# Patient Record
Sex: Female | Born: 1975
Health system: Southern US, Community
[De-identification: ages and names within clinical notes are randomized; demographics above are authoritative.]

## PROBLEM LIST (undated history)

## (undated) DIAGNOSIS — J45909 Unspecified asthma, uncomplicated: Secondary | ICD-10-CM

## (undated) DIAGNOSIS — R05 Cough: Secondary | ICD-10-CM

## (undated) DIAGNOSIS — K219 Gastro-esophageal reflux disease without esophagitis: Secondary | ICD-10-CM

## (undated) DIAGNOSIS — M26609 Unspecified temporomandibular joint disorder, unspecified side: Secondary | ICD-10-CM

## (undated) DIAGNOSIS — J310 Chronic rhinitis: Secondary | ICD-10-CM

## (undated) DIAGNOSIS — R053 Chronic cough: Secondary | ICD-10-CM

## (undated) HISTORY — DX: Unspecified temporomandibular joint disorder, unspecified side: M26.609

## (undated) HISTORY — DX: Chronic cough: R05.3

## (undated) HISTORY — DX: Unspecified asthma, uncomplicated: J45.909

## (undated) HISTORY — PX: BREAST ENHANCEMENT SURGERY: SHX7

## (undated) HISTORY — DX: Cough: R05

## (undated) HISTORY — DX: Gastro-esophageal reflux disease without esophagitis: K21.9

## (undated) HISTORY — DX: Chronic rhinitis: J31.0

---

## 2008-12-31 DIAGNOSIS — M26609 Unspecified temporomandibular joint disorder, unspecified side: Secondary | ICD-10-CM | POA: Insufficient documentation

## 2008-12-31 DIAGNOSIS — K219 Gastro-esophageal reflux disease without esophagitis: Secondary | ICD-10-CM | POA: Insufficient documentation

## 2009-01-01 ENCOUNTER — Ambulatory Visit: Payer: Self-pay | Admitting: Pulmonary Disease

## 2009-01-01 DIAGNOSIS — J310 Chronic rhinitis: Secondary | ICD-10-CM | POA: Insufficient documentation

## 2009-01-01 DIAGNOSIS — R05 Cough: Secondary | ICD-10-CM

## 2009-01-01 DIAGNOSIS — R059 Cough, unspecified: Secondary | ICD-10-CM | POA: Insufficient documentation

## 2009-02-12 ENCOUNTER — Ambulatory Visit: Payer: Self-pay | Admitting: Pulmonary Disease

## 2009-12-22 ENCOUNTER — Ambulatory Visit: Payer: Self-pay | Admitting: Pulmonary Disease

## 2010-02-08 ENCOUNTER — Ambulatory Visit: Payer: Self-pay | Admitting: Pulmonary Disease

## 2010-02-08 DIAGNOSIS — J45909 Unspecified asthma, uncomplicated: Secondary | ICD-10-CM

## 2010-02-08 HISTORY — DX: Unspecified asthma, uncomplicated: J45.909

## 2010-07-19 NOTE — Assessment & Plan Note (Signed)
Summary: 6 weeks/apc   Visit Type:  Follow-up Primary Provider/Referring Provider:  Carola Frost  CC:  The patient is here for follow-up. She says her cough is gone but she has some hoarseness and says that she first noticed this after painting and being around lots of dust She did not stop the fish oil.Nicole Foley  History of Present Illness: 35 yo female with cough possible asthma, GERD, and rhinitis.  Her cough had improved initially.  However, she started painting again about two weeks ago.  When she started doing this she noticed that her cough returned.  She has also been getting a raspy voice.  She has since stopped painting, and her symptoms are improving.  She denies sinus congestion or problems swallowing.  She has not had wheeze, fever, sputum production, or hemoptysis.    She did not stop using fish oil.  Inspite of this she did have initial improvement in her cough.  She has tried stopping omeprazole, but as soon as she does she gets terrible heartburn  Current Medications (verified): 1)  Veramyst 27.5 Mcg/spray  Susp (Fluticasone Furoate) .... Two Puffs Each Nostril Daily 2)  Zyrtec Allergy 10 Mg  Tabs (Cetirizine Hcl) .... Take 1 Tablet By Mouth Once A Day 3)  Omeprazole 20 Mg Cpdr (Omeprazole) .... Take 1 Tablet By Mouth Once A Day 4)  Qvar 80 Mcg/act Aers (Beclomethasone Dipropionate) .... 2 Puffs At Bedtime Only 5)  Singulair 10 Mg Tabs (Montelukast Sodium) .... One By Mouth At Bedtime 6)  Vitamin D3 2000 Unit Tabs (Cholecalciferol) .Nicole Foley.. 1 By Mouth Daily 7)  Quasense 0.15-0.03 Mg Tabs (Levonorgest-Eth Estrad 91-Day) .Nicole Foley.. 1 By Mouth Daily 8)  Fish Oil 1000 Mg Caps (Omega-3 Fatty Acids) .... 2 By Mouth At Bedtime  Allergies (verified): No Known Drug Allergies  Past History:  Past Medical History: Reviewed history from 12/22/2009 and no changes required.  Chronic rhinitis GERD TMJ Chronic cough    Past Surgical History: Reviewed history from 01/01/2009 and no changes  required. Breast implants  Vital Signs:  Patient profile:   35 year old female Height:      67 inches (170.18 cm) Weight:      194 pounds (88.18 kg) BMI:     30.49 O2 Sat:      98 % on Room air Temp:     98.3 degrees F (36.83 degrees C) oral Pulse rate:   80 / minute BP sitting:   118 / 62  (left arm) Cuff size:   regular  Vitals Entered By: Michel Bickers CMA (February 08, 2010 3:16 PM)  O2 Sat at Rest %:  98 O2 Flow:  Room air CC: The patient is here for follow-up. She says her cough is gone but she has some hoarseness and says that she first noticed this after painting and being around lots of dust She did not stop the fish oil. Comments Medications reviewed. Daytime phone verified. Michel Bickers [facility]  February 08, 2010 3:17 PM   Physical Exam  General:  normal appearance and healthy appearing.   Nose:  no deformity, discharge, inflammation, or lesions Mouth:  MP 2, 2+ tonsills, no oral lesions Neck:  no JVD, no thyromegaly Lungs:  clear bilaterally to auscultation and percussion Heart:  regular rate and rhythm, S1, S2 without murmurs, rubs, gallops, or clicks Extremities:  no clubbing, cyanosis, edema, or deformity noted Cervical Nodes:  no significant adenopathy   Impression & Recommendations:  Problem # 1:  COUGH (ICD-786.2) Likely from  a combination of rhinitis with post-nasal drip, irritant induced asthma, and reflux.  Advised her to use sugarless candy to maintain salivary flow, and salt water gargles to help soothe her throat.  Problem # 2:  CHRONIC RHINITIS (ICD-472.0) She is to continue her sinus regimen.  Problem # 3:  GERD (ICD-530.81) She is to continue omeprazole.  Problem # 4:  ASTHMA (ICD-493.90) She likely has asthma which is triggered by irritant exposure.  Discussed environmental control techniques.  She is to continue on her current regimen for now.  I advised her to see if she can stop using singulair after she returns from her trip in September.   Depending on her status at next follow up will see if she can further step-down her asthma regimen.  Medications Added to Medication List This Visit: 1)  Fish Oil 1000 Mg Caps (Omega-3 fatty acids) .... 2 by mouth at bedtime  Complete Medication List: 1)  Veramyst 27.5 Mcg/spray Susp (Fluticasone furoate) .... Two puffs each nostril daily 2)  Zyrtec Allergy 10 Mg Tabs (Cetirizine hcl) .... Take 1 tablet by mouth once a day 3)  Omeprazole 20 Mg Cpdr (Omeprazole) .... Take 1 tablet by mouth once a day 4)  Qvar 80 Mcg/act Aers (Beclomethasone dipropionate) .... 2 puffs at bedtime only 5)  Singulair 10 Mg Tabs (Montelukast sodium) .... One by mouth at bedtime 6)  Vitamin D3 2000 Unit Tabs (Cholecalciferol) .Nicole Foley.. 1 by mouth daily 7)  Quasense 0.15-0.03 Mg Tabs (Levonorgest-eth estrad 91-day) .Nicole Foley.. 1 by mouth daily 8)  Fish Oil 1000 Mg Caps (Omega-3 fatty acids) .... 2 by mouth at bedtime  Other Orders: Est. Patient Level III (16109)  Patient Instructions: 1)  Try using salt water gargles two times a day as needed 2)  Use sugarless candy to keep your mouth moist 3)  Can try stopping singulair once you return from your trip in September 4)  Follow up in 6 months

## 2010-07-19 NOTE — Assessment & Plan Note (Signed)
Summary: follow up/cb   Visit Type:  Follow-up Primary Provider/Referring Provider:  Carola Frost  CC:  Cough follow-up. the patient c/o increased dry cough after using tile cleaner and inhaling the fumes..  History of Present Illness: 35 yo female with cough possible asthma, GERD, and rhinitis.  She was using a cleaner while she doing some painting.  This occured about one month ago.  She thinks she inhaled some of the fumes from the cleaner.  She does not recall what type of cleaning fluid this was, or what the ingredients are.  After this she felt a burning sensation in her lungs.  She then started coughing, and has been doing so since.  She restarted Qvar, and has been using this for the past month.  This has helped some.  She was treated for a sinus infection in April, but her sinuses have been doing okay.  She still has a dry cough.  She feels that the heat has caused trouble with her.  She also seems to cough more after drinking cold liquids.  She feels likes she has a globus sensaton.  She gets popping in her ears at times.    She denies fever, sweates, chills, hemoptysis, gland swelling, or skin rash.   CXR  Procedure date:  12/22/2009  Findings:      DG CHEST 2 VIEW - [id number]   Clinical Data: Cough.   CHEST - 2 VIEW   Comparison: None.   Findings: Lungs clear.  Heart size normal.  No pleural effusion or focal bony abnormality.   IMPRESSION: No acute disease.   Current Medications (verified): 1)  Veramyst 27.5 Mcg/spray  Susp (Fluticasone Furoate) .... Two Puffs Each Nostril Daily 2)  Zyrtec Allergy 10 Mg  Tabs (Cetirizine Hcl) .... Take 1 Tablet By Mouth Once A Day 3)  Omeprazole 20 Mg Cpdr (Omeprazole) .... Take 1 Tablet By Mouth Once A Day 4)  Birth Control Pills .... As Directed 5)  Qvar 80 Mcg/act Aers (Beclomethasone Dipropionate) .... 2 Puffs At Bedtime Only 6)  Singulair 10 Mg Tabs (Montelukast Sodium) .... One By Mouth At Bedtime 7)  Vitamin D3 2000  Unit Tabs (Cholecalciferol) .Marland Kitchen.. 1 By Mouth Daily 8)  Fish Oil 1000 Mg Caps (Omega-3 Fatty Acids) .Marland Kitchen.. 1 By Mouth Daily 9)  Quasense 0.15-0.03 Mg Tabs (Levonorgest-Eth Estrad 91-Day) .Marland Kitchen.. 1 By Mouth Daily  Allergies (verified): No Known Drug Allergies  Past History:  Past Medical History:  Chronic rhinitis GERD TMJ Chronic cough    Past Surgical History: Reviewed history from 01/01/2009 and no changes required. Breast implants  Vital Signs:  Patient profile:   35 year old female Height:      67 inches (170.18 cm) Weight:      197 pounds (89.55 kg) BMI:     30.97 O2 Sat:      99 % on Room air Temp:     99.0 degrees F (37.22 degrees C) oral Pulse rate:   75 / minute BP sitting:   100 / 70  (right arm) Cuff size:   regular  Vitals Entered By: Michel Bickers CMA (December 22, 2009 2:23 PM)  O2 Sat at Rest %:  99 O2 Flow:  Room air CC: Cough follow-up. the patient c/o increased dry cough after using tile cleaner and inhaling the fumes. Comments Medications reviewed and phone number verified. Michel Bickers [facility]  December 22, 2009 2:24 PM   Physical Exam  General:  normal appearance and healthy appearing.  Nose:  no deformity, discharge, inflammation, or lesions Mouth:  MP 2, 2+ tonsills, no oral lesions Neck:  no JVD, no thyromegaly Lungs:  coarse breath sounds, no wheeze or rales Heart:  regular rate and rhythm, S1, S2 without murmurs, rubs, gallops, or clicks Extremities:  no clubbing, cyanosis, edema, or deformity noted Cervical Nodes:  no significant adenopathy   Impression & Recommendations:  Problem # 1:  COUGH (ICD-786.2) She likely has asthma with worsening of her symptoms after inhalation of cleaning solution fumes.  I will give her a prednisone taper.  She is to continue Qvar and singulair.  Depending on her response will decide if further interventions are needed.  Problem # 2:  CHRONIC RHINITIS (ICD-472.0) She is to continue on veramyst, singulair, and as needed  zyrtec.  I have advised her to try using nasal irrigatio on a more regular basis.  Problem # 3:  GERD (ICD-530.81) Some of her cough may be related to reflux.  I have advised her to continue with omeprazole.  I advised her to stop fish oil for now as this could be exacerbating her reflux.  She has been using fish oil to treat dry eyes.  I have asked her to consult with her eye doctor first to see if it is safe to stop fish oil.  Medications Added to Medication List This Visit: 1)  Vitamin D3 2000 Unit Tabs (Cholecalciferol) .Marland Kitchen.. 1 by mouth daily 2)  Fish Oil 1000 Mg Caps (Omega-3 fatty acids) .Marland Kitchen.. 1 by mouth daily 3)  Quasense 0.15-0.03 Mg Tabs (Levonorgest-eth estrad 91-day) .Marland Kitchen.. 1 by mouth daily 4)  Prednisone 10 Mg Tabs (Prednisone) .... 2 pills once daily for 2 days, 1 pill once daily for 2 days, 1/2 pill once daily for 2 days  Complete Medication List: 1)  Veramyst 27.5 Mcg/spray Susp (Fluticasone furoate) .... Two puffs each nostril daily 2)  Zyrtec Allergy 10 Mg Tabs (Cetirizine hcl) .... Take 1 tablet by mouth once a day 3)  Omeprazole 20 Mg Cpdr (Omeprazole) .... Take 1 tablet by mouth once a day 4)  Qvar 80 Mcg/act Aers (Beclomethasone dipropionate) .... 2 puffs at bedtime only 5)  Singulair 10 Mg Tabs (Montelukast sodium) .... One by mouth at bedtime 6)  Vitamin D3 2000 Unit Tabs (Cholecalciferol) .Marland Kitchen.. 1 by mouth daily 7)  Quasense 0.15-0.03 Mg Tabs (Levonorgest-eth estrad 91-day) .Marland Kitchen.. 1 by mouth daily 8)  Prednisone 10 Mg Tabs (Prednisone) .... 2 pills once daily for 2 days, 1 pill once daily for 2 days, 1/2 pill once daily for 2 days  Other Orders: Est. Patient Level IV (99214) T-2 View CXR (71020TC)  Patient Instructions: 1)  Continue Qvar two puffs two times a day 2)  Continue singulair 10 mg at bedtime 3)  Continue zyrtec once daily as needed  4)  Continue veramyst two sprays once daily 5)  Continue nasal irrigation once daily  6)  Stop fish oil until next visit 7)   Continue omeprazole once daily  8)  Prednisone 10 mg pills: 2 pills once daily for 2 days, 1 pill once daily for 2 days, 1/2 pill once daily for 2 days 9)  Folllow up in 6 weeks Prescriptions: PREDNISONE 10 MG TABS (PREDNISONE) 2 pills once daily for 2 days, 1 pill once daily for 2 days, 1/2 pill once daily for 2 days  #7 x 0   Entered and Authorized by:   Coralyn Helling MD   Signed by:   Coralyn Helling MD  on 12/22/2009   Method used:   Electronically to        [facility]. [phone]* (retail)       [address]       [place], [place]  [address]       Ph: [phone] or [phone]       Fax: [phone]   RxID:   [id number]

## 2010-07-20 ENCOUNTER — Ambulatory Visit: Admit: 2010-07-20 | Payer: Self-pay | Admitting: Pulmonary Disease

## 2010-07-20 ENCOUNTER — Encounter: Payer: Self-pay | Admitting: Pulmonary Disease

## 2010-07-20 ENCOUNTER — Ambulatory Visit (INDEPENDENT_AMBULATORY_CARE_PROVIDER_SITE_OTHER): Payer: BC Managed Care – PPO | Admitting: Pulmonary Disease

## 2010-07-20 DIAGNOSIS — R05 Cough: Secondary | ICD-10-CM

## 2010-07-20 DIAGNOSIS — J45909 Unspecified asthma, uncomplicated: Secondary | ICD-10-CM

## 2010-07-20 DIAGNOSIS — J310 Chronic rhinitis: Secondary | ICD-10-CM

## 2010-07-20 DIAGNOSIS — R059 Cough, unspecified: Secondary | ICD-10-CM

## 2010-07-20 DIAGNOSIS — K219 Gastro-esophageal reflux disease without esophagitis: Secondary | ICD-10-CM

## 2010-07-27 NOTE — Assessment & Plan Note (Signed)
Summary: per pt call   Vital Signs:  Patient profile:   35 year old female Height:      67 inches Weight:      185.13 pounds BMI:     29.10 O2 Sat:      99 % on Room air Temp:     98.5 degrees F oral Pulse rate:   60 / minute BP sitting:   114 / 62  (left arm) Cuff size:   regular  Vitals Entered By: Carver Fila (July 20, 2010 2:48 PM)  O2 Flow:  Room air CC: follow up. Pt c/o dry cough. Pt states feels like she isn't getting enough air when breathing in.  Comments meds and allergies updated Phone number updated Carver Fila  July 20, 2010 2:48 PM    Primary Provider/Referring Provider:  Carola Frost  CC:  follow up. Pt c/o dry cough. Pt states feels like she isn't getting enough air when breathing in. Marland Kitchen  History of Present Illness: 35 yo female with cough possible asthma, GERD, and rhinitis.  She had stopped her inhalers and singulair in October.  She was doing well until December.  She went on a Christmas home tour.  She had a lot of dust, mold, and pine exposures.  She got a sinus infection, and was treated with levaquin and prednisone.  This helped.  She then went on a cruise, and got another sinus infection.  This was again treated with levaquin and prednisone.    She then went to [facility], and her sinuses flared up again.  She has been getting sinus congestion, post-nasal drip, and dry cough.  She feels like there is a tickle in her throat.  Her symptoms have improved, but not resolved.  She has not been getting fever, wheezing, chest pain/tightness, gland swelling, or leg swelling.  She has not been using veramyst since October.  She tried stopping omeprazole, but got horrible heartburn.  As a result she restarted omeprazole, and her heartburn improved.   Preventive Screening-Counseling & Management  Alcohol-Tobacco     Smoking Status: never  Current Medications (verified): 1)  Zyrtec Allergy 10 Mg  Tabs (Cetirizine Hcl) .... Take 1 Tablet By Mouth Once A  Day 2)  Omeprazole 20 Mg Cpdr (Omeprazole) .... Take 1 Tablet By Mouth Once A Day 3)  Vitamin D3 2000 Unit Tabs (Cholecalciferol) .Marland Kitchen.. 1 By Mouth Daily 4)  Quasense 0.15-0.03 Mg Tabs (Levonorgest-Eth Estrad 91-Day) .Marland Kitchen.. 1 By Mouth Daily 5)  Fish Oil 1000 Mg Caps (Omega-3 Fatty Acids) .... 2 By Mouth At Bedtime  Allergies (verified): No Known Drug Allergies  Past History:  Past Medical History: Chronic rhinitis GERD TMJ Chronic cough    Past Surgical History: Reviewed history from 01/01/2009 and no changes required. Breast implants  Social History: Marital status: Divorced, lives with Darwin Smith-boyfriend Occupation: Self employeed ETOH: 4 to 10 glasses of wine per month Patient never smoked.  Smoking Status:  never  Physical Exam  General:  normal appearance and healthy appearing.   Ears:  TMs intact and clear with normal canals Nose:  clear nasal discharge, boggy mucosa Mouth:  2+ tonsills, mild erythema, no exudate Neck:  no JVD, no thyromegaly Lungs:  clear bilaterally to auscultation and percussion Heart:  regular rate and rhythm, S1, S2 without murmurs, rubs, gallops, or clicks Extremities:  no clubbing, cyanosis, edema, or deformity noted Neurologic:  normal CN II-XII and strength normal.   Cervical Nodes:  no significant adenopathy Psych:  alert and cooperative; normal mood and affect; normal attention span and concentration   Impression & Recommendations:  Problem # 1:  COUGH (ICD-786.2)  Likely related to recent flare of sinuses and allergies.  She has some improvement after recent prednisone and antibiotic therapy, but her symptoms persist.  I don't think she needs additional antibiotics or prednisone at this time.  Will have her use nasal irrigation and gave sample of nasonex.  She is to call if she does not have further improvement.  Problem # 2:  CHRONIC RHINITIS (ICD-472.0)  She is to continue zyrtec.  Problem # 3:  GERD (ICD-530.81) Will refill  her omeprazole.  Problem # 4:  ASTHMA (ICD-493.90) Most of her current symptoms are related to her upper airway and sinuses.  I do not think she needs anything specifically for asthma at this time.  Medications Added to Medication List This Visit: 1)  Nasonex 50 Mcg/act Susp (Mometasone furoate) .... Two sprays once daily for two weeks, then stop  Complete Medication List: 1)  Zyrtec Allergy 10 Mg Tabs (Cetirizine hcl) .... Take 1 tablet by mouth once a day 2)  Omeprazole 20 Mg Cpdr (Omeprazole) .... Take 1 tablet by mouth once a day 3)  Vitamin D3 2000 Unit Tabs (Cholecalciferol) .Marland Kitchen.. 1 by mouth daily 4)  Quasense 0.15-0.03 Mg Tabs (Levonorgest-eth estrad 91-day) .Marland Kitchen.. 1 by mouth daily 5)  Fish Oil 1000 Mg Caps (Omega-3 fatty acids) .... 2 by mouth at bedtime 6)  Nasonex 50 Mcg/act Susp (Mometasone furoate) .... Two sprays once daily for two weeks, then stop  Other Orders: Est. Patient Level IV (29562)  Patient Instructions: 1)  Saline nasal rinse once daily for one week, then as needed 2)  Nasonex two sprays once daily for 2 weeks 3)  Follow up in 6 months Prescriptions: OMEPRAZOLE 20 MG CPDR (OMEPRAZOLE) Take 1 tablet by mouth once a day  #90 x 3   Entered and Authorized by:   Coralyn Helling MD   Signed by:   Coralyn Helling MD on 07/20/2010   Method used:   Electronically to        [facility]. [phone]* (retail)       [address]       [place], [place]  [address]       Ph: [phone] or [phone]       Fax: [phone]   RxID:   [id number]    Orders Added: 1)  Est. Patient Level IV [18841]

## 2011-06-06 ENCOUNTER — Other Ambulatory Visit: Payer: Self-pay | Admitting: Pulmonary Disease

## 2011-07-04 ENCOUNTER — Telehealth: Payer: Self-pay | Admitting: Pulmonary Disease

## 2011-07-04 NOTE — Telephone Encounter (Signed)
I spoke with pt and she states she has been having an ongoing cough x 4 weeks and wanted to be seen this Friday at 9:15 by VS. I offered pt and apt today to see RB but refused bc she lives in [place] and wanted to come when Brayton Mars came here for an apt on Friday at 9:15 w/ VS. I offered pt an apt to see TP on Friday at the same time but also refused bc she wanted to be in the room w/ Darwin. Pt then advised me she would go to [facility]. I offered to make pt a follow up apt since pt has not been seen since 07/20/10 but refused to make an apt. Will forward to VS as an [place]

## 2011-07-07 ENCOUNTER — Telehealth: Payer: Self-pay | Admitting: Pulmonary Disease

## 2011-07-07 MED ORDER — MONTELUKAST SODIUM 10 MG PO TABS: 10.0000 mg | ORAL_TABLET | Freq: Every day | ORAL | Status: DC

## 2011-07-07 MED ORDER — OMEPRAZOLE 20 MG PO CPDR: 20.0000 mg | DELAYED_RELEASE_CAPSULE | Freq: Every day | ORAL | Status: DC

## 2011-07-07 MED ORDER — BECLOMETHASONE DIPROPIONATE 80 MCG/ACT IN AERS: 2.0000 | INHALATION_SPRAY | Freq: Two times a day (BID) | RESPIRATORY_TRACT | Status: DC

## 2011-07-07 NOTE — Telephone Encounter (Signed)
She has increased cough after recent sinus infection.  Had ABx from PCP with improvement in sinus symptoms.  Still has cough which has improved with resumption of Qvar.  She also has cough after eating.  Will refill Qvar 80 mcg two puffs bid, singulair 10 mg qhs, and omeprazole 40 mg daily.  Will arrange for ROV in February to further assess.

## 2011-07-31 ENCOUNTER — Encounter: Payer: Self-pay | Admitting: *Deleted

## 2011-08-01 ENCOUNTER — Encounter: Payer: Self-pay | Admitting: Pulmonary Disease

## 2011-08-01 ENCOUNTER — Ambulatory Visit (INDEPENDENT_AMBULATORY_CARE_PROVIDER_SITE_OTHER): Payer: BC Managed Care – PPO | Admitting: Pulmonary Disease

## 2011-08-01 DIAGNOSIS — J45909 Unspecified asthma, uncomplicated: Secondary | ICD-10-CM

## 2011-08-01 DIAGNOSIS — R05 Cough: Secondary | ICD-10-CM

## 2011-08-01 DIAGNOSIS — K219 Gastro-esophageal reflux disease without esophagitis: Secondary | ICD-10-CM

## 2011-08-01 DIAGNOSIS — R059 Cough, unspecified: Secondary | ICD-10-CM

## 2011-08-01 DIAGNOSIS — J310 Chronic rhinitis: Secondary | ICD-10-CM

## 2011-08-01 NOTE — Assessment & Plan Note (Addendum)
Will monitor her symptoms off Qvar.  She is to continue singulair for now, but advised she could stop this if her symptoms continue to improve.

## 2011-08-01 NOTE — Assessment & Plan Note (Signed)
She is to continue omeprazole.

## 2011-08-01 NOTE — Assessment & Plan Note (Signed)
Stable.  Monitor clinically.  She typically has exacerbations with strong smells, and respiratory infections.

## 2011-08-01 NOTE — Progress Notes (Signed)
Chief Complaint  Patient presents with  . Follow-up    Pt c/o dry cough for several months, worse after meals.   Primary Provider/Referring Provider: Carola Frost  History of Present Illness: Nicole Foley is a 36 y.o. female with cough 2nd to asthma, GERD, and rhinitis.  Her cough has improved since I spoke with her last in January.  She stopped Qvar about one week ago, and feels her symptoms have been stable.  She continues to use singulair and prilosec.  She will still get a cough after eating.  She is not having any trouble with her sinuses at present.  Past Medical History  Diagnosis Date  . Chronic rhinitis   . GERD (gastroesophageal reflux disease)   . TMJ (temporomandibular joint syndrome)   . Chronic cough     Past Surgical History  Procedure Date  . Breast enhancement surgery     No Known Allergies  Physical Exam:  Blood pressure 104/66, pulse 86, temperature 98.9 F (37.2 C), temperature source Oral, height 5\' 7"  (1.702 m), weight 192 lb 9.6 oz (87.363 kg), SpO2 98.00%. Body mass index is 30.17 kg/(m^2). Wt Readings from Last 2 Encounters:  08/01/11 192 lb 9.6 oz (87.363 kg)  07/20/10 185 lb 2.1 oz (83.975 kg)    General - healthy HEENT - no sinus tenderness, no oral exudate, no LAN Cardiac - s1s2 regular, no murmur Chest - no wheeze/rales Abdomen - soft, nontender Extremities - no edema Skin - no rashes Neurologic - normal strength Psychiatric - normal mood, behavior   Assessment/Plan:  Outpatient Encounter Prescriptions as of 08/01/2011  Medication Sig Dispense Refill  . cetirizine (ZYRTEC) 10 MG chewable tablet Chew 10 mg by mouth daily.      . Cholecalciferol (VITAMIN D3) 2000 UNITS TABS Take 1 tablet by mouth daily.      . fish oil-omega-3 fatty acids 1000 MG capsule Take 2 g by mouth daily.      Marland Kitchen levonorgestrel-ethinyl estradiol (SEASONALE,INTROVALE,JOLESSA) 0.15-0.03 MG tablet Take 1 tablet by mouth daily.      . montelukast (SINGULAIR) 10 MG  tablet Take 1 tablet (10 mg total) by mouth at bedtime.  30 tablet  3  . Nutritional Supplements (JUICE PLUS FIBRE PO) Take 6 tablets by mouth daily.      Marland Kitchen omeprazole (PRILOSEC) 20 MG capsule Take 1 capsule (20 mg total) by mouth daily.  30 capsule  3  . beclomethasone (QVAR) 80 MCG/ACT inhaler Inhale 2 puffs into the lungs 2 (two) times daily.  1 Inhaler  3  . mometasone (NASONEX) 50 MCG/ACT nasal spray Place 2 sprays into the nose daily.        [facility] Pager:  [phone] 08/01/2011, 1:55 PM

## 2011-08-01 NOTE — Patient Instructions (Signed)
Follow up in 6 months 

## 2011-08-01 NOTE — Assessment & Plan Note (Signed)
Secondary to asthma, reflux, and post-nasal drip.  Improved.

## 2011-08-04 ENCOUNTER — Telehealth: Payer: Self-pay | Admitting: Pulmonary Disease

## 2011-08-04 MED ORDER — LEVOFLOXACIN 750 MG PO TABS: 750.0000 mg | ORAL_TABLET | Freq: Every day | ORAL | Status: AC

## 2011-08-04 NOTE — Telephone Encounter (Signed)
I spoke with pt and is aware Levaquin was called into the pharmacy and is aware of the directions. Will forward to VS as an [place]

## 2011-08-04 NOTE — Telephone Encounter (Signed)
levaquin 750 one daily x 5 days

## 2011-08-04 NOTE — Telephone Encounter (Signed)
I spoke with pt and she stated starting this morning she started coughing up green phlem. She states her cough was getting better until the weather started turning cold on wed. Denies any fever, chills, sweats, sob, nausea, vomiting. She is using her inhalers as directed. Pt is requesting to have Levaquin called in for her bc she states this"knocks it right out". Pt was just in and saw VS on 08/01/11. Since VS is not in will forward to doc of the day for recs. Please advise Dr. Sherene Sires, thanks  No Known Allergies      [facility]

## 2011-08-09 ENCOUNTER — Telehealth: Payer: Self-pay | Admitting: Pulmonary Disease

## 2011-08-09 NOTE — Telephone Encounter (Signed)
I spoke with pt and advised her of KC recs. She voiced her understanding and will try the mucinex dm extra strength and WCB for an apt if not getting any better. She needed nothing further

## 2011-08-09 NOTE — Telephone Encounter (Signed)
Called, spoke with pt.  She was seen by Dr. Craige Cotta on 08/01/11 for f/u and levaquin x 5 days was called in on 08/04/11 by Dr. Sherene Sires.  Pt states she is feeling better but still not back to her normal.  States she has a "rattle in chest," PND, and prod cough with green mucus.  Pt states the cough is mainly in the mornings and qhs.  She denies increased SOB, wheezing, and fever.  She finished the levaquin yesterday and is requesting it be extended.  I offered appt with [facility] for this evening but she declined stating she is too tired to drive from [place] to here and just wants to stay home to rest.  Advised I will send message to doc of the day as Dr. Craige Cotta is not back in the office until next week -- Dr. Shelle Iron, pls advise.  Thanks!  No Known Allergies - verified [facility]

## 2011-08-09 NOTE — Telephone Encounter (Signed)
Let her know that 5 days of levaquin is enough to treat the majority of infections, including pna.  I suspect her residual symptoms are due to inflammation and will clear over time.  The abx actually keeps working for another week. She can take mucinex dm extrastrength one bid until better and take with large glass of water.  If she is not improving, or feels she needs more aggressive treatment, will need ov.

## 2011-08-16 ENCOUNTER — Telehealth: Payer: Self-pay | Admitting: Pulmonary Disease

## 2011-08-16 NOTE — Telephone Encounter (Signed)
ATC pt.  Line ring multiple times and then hangs up.  WCB later.

## 2011-08-17 NOTE — Telephone Encounter (Signed)
Patient still c/o allergy problems. Persistent cough, milky green nasal drainage. She wants to know if she should be seen by an allergy specialist for allergy testing. Pt aware we will not be able to speak with Dr. Craige Cotta until Fri., 3/1 after 3pm. Pt verbalized understanding. Dr. Craige Cotta, pls advise.

## 2011-08-18 NOTE — Telephone Encounter (Signed)
Called and spoke with pt. She states that she would like to be seen here, but unsure if she wants to wait until [facility] first available consult slot which is for 09/19/11. She states that she will call around and see if she can get in with an allergist there in [place] sooner, and wanted to go ahead and schedule appt for 09/19/11 with [facility] in case she can not. I offered to make referral, but she states preferred to research this on her own. Pt states will call and cancel [facility] appt if needed. Nothing further needed.

## 2011-08-18 NOTE — Telephone Encounter (Signed)
Please place referral to allergist.  Advise her that we can arrange referral to Dr. Maple Hudson if she would like, or alternative provider if she prefers.

## 2011-09-19 ENCOUNTER — Institutional Professional Consult (permissible substitution): Payer: BC Managed Care – PPO | Admitting: Internal Medicine

## 2011-10-22 ENCOUNTER — Other Ambulatory Visit: Payer: Self-pay | Admitting: Pulmonary Disease

## 2015-02-16 DIAGNOSIS — K219 Gastro-esophageal reflux disease without esophagitis: Secondary | ICD-10-CM | POA: Insufficient documentation

## 2015-02-16 DIAGNOSIS — J309 Allergic rhinitis, unspecified: Secondary | ICD-10-CM | POA: Insufficient documentation

## 2015-02-16 DIAGNOSIS — J329 Chronic sinusitis, unspecified: Secondary | ICD-10-CM | POA: Insufficient documentation

## 2015-09-20 DIAGNOSIS — M50323 Other cervical disc degeneration at C6-C7 level: Secondary | ICD-10-CM | POA: Diagnosis not present

## 2015-09-20 DIAGNOSIS — M546 Pain in thoracic spine: Secondary | ICD-10-CM | POA: Diagnosis not present

## 2015-09-20 DIAGNOSIS — M5413 Radiculopathy, cervicothoracic region: Secondary | ICD-10-CM | POA: Diagnosis not present

## 2015-09-22 DIAGNOSIS — M546 Pain in thoracic spine: Secondary | ICD-10-CM | POA: Diagnosis not present

## 2015-09-22 DIAGNOSIS — J0100 Acute maxillary sinusitis, unspecified: Secondary | ICD-10-CM | POA: Diagnosis not present

## 2015-09-22 DIAGNOSIS — M50323 Other cervical disc degeneration at C6-C7 level: Secondary | ICD-10-CM | POA: Diagnosis not present

## 2015-09-22 DIAGNOSIS — M5413 Radiculopathy, cervicothoracic region: Secondary | ICD-10-CM | POA: Diagnosis not present

## 2015-10-05 DIAGNOSIS — M50321 Other cervical disc degeneration at C4-C5 level: Secondary | ICD-10-CM | POA: Diagnosis not present

## 2015-10-05 DIAGNOSIS — M47812 Spondylosis without myelopathy or radiculopathy, cervical region: Secondary | ICD-10-CM | POA: Diagnosis not present

## 2015-10-07 DIAGNOSIS — M50323 Other cervical disc degeneration at C6-C7 level: Secondary | ICD-10-CM | POA: Diagnosis not present

## 2015-10-07 DIAGNOSIS — M546 Pain in thoracic spine: Secondary | ICD-10-CM | POA: Diagnosis not present

## 2015-10-07 DIAGNOSIS — M5413 Radiculopathy, cervicothoracic region: Secondary | ICD-10-CM | POA: Diagnosis not present

## 2015-10-12 DIAGNOSIS — M50323 Other cervical disc degeneration at C6-C7 level: Secondary | ICD-10-CM | POA: Diagnosis not present

## 2015-10-12 DIAGNOSIS — M5413 Radiculopathy, cervicothoracic region: Secondary | ICD-10-CM | POA: Diagnosis not present

## 2015-10-12 DIAGNOSIS — M546 Pain in thoracic spine: Secondary | ICD-10-CM | POA: Diagnosis not present

## 2015-10-14 DIAGNOSIS — M546 Pain in thoracic spine: Secondary | ICD-10-CM | POA: Diagnosis not present

## 2015-10-14 DIAGNOSIS — M5413 Radiculopathy, cervicothoracic region: Secondary | ICD-10-CM | POA: Diagnosis not present

## 2015-10-14 DIAGNOSIS — M50323 Other cervical disc degeneration at C6-C7 level: Secondary | ICD-10-CM | POA: Diagnosis not present

## 2015-10-19 DIAGNOSIS — M5413 Radiculopathy, cervicothoracic region: Secondary | ICD-10-CM | POA: Diagnosis not present

## 2015-10-19 DIAGNOSIS — M546 Pain in thoracic spine: Secondary | ICD-10-CM | POA: Diagnosis not present

## 2015-10-19 DIAGNOSIS — M50323 Other cervical disc degeneration at C6-C7 level: Secondary | ICD-10-CM | POA: Diagnosis not present

## 2015-10-26 DIAGNOSIS — M546 Pain in thoracic spine: Secondary | ICD-10-CM | POA: Diagnosis not present

## 2015-10-26 DIAGNOSIS — M5413 Radiculopathy, cervicothoracic region: Secondary | ICD-10-CM | POA: Diagnosis not present

## 2015-10-26 DIAGNOSIS — M50323 Other cervical disc degeneration at C6-C7 level: Secondary | ICD-10-CM | POA: Diagnosis not present

## 2016-01-21 DIAGNOSIS — J3089 Other allergic rhinitis: Secondary | ICD-10-CM | POA: Diagnosis not present

## 2016-01-21 DIAGNOSIS — J4520 Mild intermittent asthma, uncomplicated: Secondary | ICD-10-CM | POA: Diagnosis not present

## 2016-05-02 DIAGNOSIS — L720 Epidermal cyst: Secondary | ICD-10-CM | POA: Diagnosis not present

## 2016-05-02 DIAGNOSIS — D485 Neoplasm of uncertain behavior of skin: Secondary | ICD-10-CM | POA: Diagnosis not present

## 2016-05-02 DIAGNOSIS — D225 Melanocytic nevi of trunk: Secondary | ICD-10-CM | POA: Diagnosis not present

## 2016-06-07 DIAGNOSIS — E876 Hypokalemia: Secondary | ICD-10-CM | POA: Diagnosis not present

## 2016-06-07 DIAGNOSIS — N920 Excessive and frequent menstruation with regular cycle: Secondary | ICD-10-CM | POA: Diagnosis not present

## 2016-06-07 DIAGNOSIS — Z0001 Encounter for general adult medical examination with abnormal findings: Secondary | ICD-10-CM | POA: Diagnosis not present

## 2016-06-07 DIAGNOSIS — J309 Allergic rhinitis, unspecified: Secondary | ICD-10-CM | POA: Diagnosis not present

## 2016-06-15 DIAGNOSIS — L720 Epidermal cyst: Secondary | ICD-10-CM | POA: Diagnosis not present

## 2016-08-15 DIAGNOSIS — J0100 Acute maxillary sinusitis, unspecified: Secondary | ICD-10-CM | POA: Diagnosis not present

## 2016-09-21 DIAGNOSIS — J3089 Other allergic rhinitis: Secondary | ICD-10-CM | POA: Diagnosis not present

## 2016-09-21 DIAGNOSIS — J4520 Mild intermittent asthma, uncomplicated: Secondary | ICD-10-CM | POA: Diagnosis not present

## 2017-01-02 DIAGNOSIS — J0140 Acute pansinusitis, unspecified: Secondary | ICD-10-CM | POA: Diagnosis not present

## 2017-02-06 DIAGNOSIS — L720 Epidermal cyst: Secondary | ICD-10-CM | POA: Diagnosis not present

## 2017-02-28 DIAGNOSIS — J0140 Acute pansinusitis, unspecified: Secondary | ICD-10-CM | POA: Diagnosis not present

## 2017-03-27 DIAGNOSIS — F4320 Adjustment disorder, unspecified: Secondary | ICD-10-CM | POA: Diagnosis not present

## 2017-04-05 DIAGNOSIS — J3089 Other allergic rhinitis: Secondary | ICD-10-CM | POA: Diagnosis not present

## 2017-04-18 DIAGNOSIS — B354 Tinea corporis: Secondary | ICD-10-CM | POA: Diagnosis not present

## 2017-04-20 DIAGNOSIS — J0110 Acute frontal sinusitis, unspecified: Secondary | ICD-10-CM | POA: Diagnosis not present

## 2017-05-14 DIAGNOSIS — L300 Nummular dermatitis: Secondary | ICD-10-CM | POA: Diagnosis not present

## 2017-06-28 DIAGNOSIS — J0100 Acute maxillary sinusitis, unspecified: Secondary | ICD-10-CM | POA: Diagnosis not present

## 2017-06-28 DIAGNOSIS — J3089 Other allergic rhinitis: Secondary | ICD-10-CM | POA: Diagnosis not present

## 2017-07-23 DIAGNOSIS — L308 Other specified dermatitis: Secondary | ICD-10-CM | POA: Diagnosis not present

## 2017-07-23 DIAGNOSIS — C44712 Basal cell carcinoma of skin of right lower limb, including hip: Secondary | ICD-10-CM | POA: Diagnosis not present

## 2017-09-04 DIAGNOSIS — N926 Irregular menstruation, unspecified: Secondary | ICD-10-CM | POA: Diagnosis not present

## 2017-09-04 DIAGNOSIS — M25472 Effusion, left ankle: Secondary | ICD-10-CM | POA: Diagnosis not present

## 2017-09-04 DIAGNOSIS — Z124 Encounter for screening for malignant neoplasm of cervix: Secondary | ICD-10-CM | POA: Diagnosis not present

## 2017-09-04 DIAGNOSIS — Z1331 Encounter for screening for depression: Secondary | ICD-10-CM | POA: Diagnosis not present

## 2017-09-04 DIAGNOSIS — R350 Frequency of micturition: Secondary | ICD-10-CM | POA: Diagnosis not present

## 2017-09-04 DIAGNOSIS — Z0001 Encounter for general adult medical examination with abnormal findings: Secondary | ICD-10-CM | POA: Diagnosis not present

## 2017-09-04 DIAGNOSIS — R002 Palpitations: Secondary | ICD-10-CM | POA: Diagnosis not present

## 2017-09-04 DIAGNOSIS — M7989 Other specified soft tissue disorders: Secondary | ICD-10-CM | POA: Diagnosis not present

## 2017-09-04 DIAGNOSIS — M25572 Pain in left ankle and joints of left foot: Secondary | ICD-10-CM | POA: Diagnosis not present

## 2017-09-20 DIAGNOSIS — J309 Allergic rhinitis, unspecified: Secondary | ICD-10-CM | POA: Diagnosis not present

## 2017-09-21 ENCOUNTER — Ambulatory Visit: Payer: BLUE CROSS/BLUE SHIELD | Admitting: Sports Medicine

## 2017-09-21 ENCOUNTER — Encounter: Payer: Self-pay | Admitting: Sports Medicine

## 2017-09-21 DIAGNOSIS — M79672 Pain in left foot: Secondary | ICD-10-CM

## 2017-09-21 DIAGNOSIS — M779 Enthesopathy, unspecified: Secondary | ICD-10-CM

## 2017-09-21 MED ORDER — MELOXICAM 15 MG PO TABS: 15.0000 mg | ORAL_TABLET | Freq: Every day | ORAL | 0 refills | Status: DC

## 2017-09-21 NOTE — Progress Notes (Signed)
Subjective:  Nicole Foley is a 42 y.o. female patient who presents to office for evaluation of left ankle pain. Patient complains of continued pain in the ankle since February 25.  Patient states that she was hanging decorations and had pain afterwards and when she was sitting she noticed that her ankle was swollen especially painful with activities where she is is on uneven surfaces or to rise or attempting to go up steps.  Patient reports that she mentioned this to her primary care doctor a few weeks ago and they sent her for an x-ray of her foot and ankle which was negative. Patient has tried ibuprofen that helps a little.  Ranks pain 3 out of 10 a dull achy pain with swelling.  Patient denies any other pedal complaints. Denies known injury/trip/fall/sprain/any other causative factors.   Review of Systems  Musculoskeletal: Positive for joint pain.  All other systems reviewed and are negative.   Patient Active Problem List   Diagnosis Date Noted  . Allergic rhinitis 02/16/2015  . LPRD (laryngopharyngeal reflux disease) 02/16/2015  . Sinusitis 02/16/2015  . ASTHMA 02/08/2010  . Chronic rhinitis 01/01/2009  . Cough 01/01/2009  . GERD 12/31/2008    Current Outpatient Medications on File Prior to Visit  Medication Sig Dispense Refill  . Albuterol Sulfate (PROAIR HFA IN) Inhale into the lungs as needed.    . cetirizine (ZYRTEC) 10 MG chewable tablet Chew 10 mg by mouth daily.    . Cholecalciferol (VITAMIN D3) 2000 UNITS TABS Take 1 tablet by mouth daily.    Marland Kitchen EPINEPHrine (EPIPEN 2-PAK IJ) Inject as directed once as needed.    Marland Kitchen Fexofenadine HCl (ALLEGRA ALLERGY PO) Take by mouth as needed.    . fish oil-omega-3 fatty acids 1000 MG capsule Take 2 g by mouth daily.    Marland Kitchen levonorgestrel-ethinyl estradiol (SEASONALE,INTROVALE,JOLESSA) 0.15-0.03 MG tablet Take 1 tablet by mouth daily.    . mometasone (NASONEX) 50 MCG/ACT nasal spray Place 2 sprays into the nose daily.    . mometasone-formoterol  (DULERA) 200-5 MCG/ACT AERO Inhale 2 puffs into the lungs 2 (two) times daily.    . montelukast (SINGULAIR) 10 MG tablet TAKE 1 TABLET BY MOUTH EVERY NIGHT AT BEDTIME 30 tablet 2  . Nutritional Supplements (JUICE PLUS FIBRE PO) Take 6 tablets by mouth daily.    . pantoprazole (PROTONIX) 40 MG tablet Take 40 mg by mouth 2 (two) times daily.    Marland Kitchen omeprazole (PRILOSEC) 20 MG capsule Take 1 capsule (20 mg total) by mouth daily. 30 capsule 3   No current facility-administered medications on file prior to visit.     No Known Allergies  Objective:  General: Alert and oriented x3 in no acute distress  Dermatology: No open lesions bilateral lower extremities, no webspace macerations, no ecchymosis bilateral, all nails x 10 are well manicured.  Vascular: Dorsalis Pedis and Posterior Tibial pedal pulses palpable, Capillary Fill Time 3 seconds,(+) pedal hair growth bilateral, mild trace edema to left lateral ankle, temperature gradient within normal limits.  Neurology: Michaell Cowing sensation intact via light touch bilateral. (- )Tinels sign bilateral.   Musculoskeletal: Mild tenderness with palpation at peroneal tendon course on the left lateral ankle. Negative talar tilt, Negative tib-fib stress, No instability. No pain with calf compression bilateral. Range of motion within normal limits with mild guarding on left ankle. Strength within normal limits in all groups bilateral.   Assessment and Plan: Problem List Items Addressed This Visit    None    Visit  Diagnoses    Tendonitis    -  Primary   Relevant Medications   meloxicam (MOBIC) 15 MG tablet   Left foot pain          -Complete examination performed -[facility] -Discussed treatement options for likely tendinitis however advised patient cannot rule out a possible partial tear unless a MRI is done which I recommend for her to have completed if still painful at next visit -Rx meloxicam to take as  instructed -Dispensed ankle gauntlet to use daily on left ankle and advised patient to avoid terrains that are strenuous or that caused extra stress and strain to left foot and ankle -Recommend protection rest ice elevation as instructed -Patient to return to office 3 weeks or sooner if condition worsens.  Asencion Islamitorya Daimon Kean, DPM

## 2017-10-10 ENCOUNTER — Encounter: Payer: Self-pay | Admitting: Sports Medicine

## 2017-10-10 ENCOUNTER — Ambulatory Visit: Payer: BLUE CROSS/BLUE SHIELD | Admitting: Sports Medicine

## 2017-10-10 ENCOUNTER — Telehealth: Payer: Self-pay | Admitting: *Deleted

## 2017-10-10 DIAGNOSIS — M79672 Pain in left foot: Secondary | ICD-10-CM

## 2017-10-10 DIAGNOSIS — R609 Edema, unspecified: Secondary | ICD-10-CM

## 2017-10-10 DIAGNOSIS — T148XXA Other injury of unspecified body region, initial encounter: Secondary | ICD-10-CM

## 2017-10-10 DIAGNOSIS — M25472 Effusion, left ankle: Secondary | ICD-10-CM

## 2017-10-10 DIAGNOSIS — M779 Enthesopathy, unspecified: Secondary | ICD-10-CM | POA: Diagnosis not present

## 2017-10-10 MED ORDER — DICLOFENAC SODIUM 75 MG PO TBEC: 75.0000 mg | DELAYED_RELEASE_TABLET | Freq: Two times a day (BID) | ORAL | 0 refills | Status: DC

## 2017-10-10 NOTE — Telephone Encounter (Signed)
[facility]. I informed pt of the appt for doppler and [facility] [phone] for scheduling purposes.

## 2017-10-10 NOTE — Telephone Encounter (Signed)
-----   Message from [place]a Stover, [place] sent at 10/10/2017  1:47 PM EDT ----- Regarding: MRI Left ankle and Venous Reflux testing  MRI Left ankle R/o tendon tear peroneal tendons   Venous reflux swelling at ankles L>R

## 2017-10-10 NOTE — Telephone Encounter (Signed)
Orders for MRI to Tomasa HoseJ. Quintana, RN for pre-cert. [facility] Alcario Drought- Erica scheduled [phone] venous reflux B/L for 10/26/2017 arrive 9:30am for 10:00am testing.

## 2017-10-10 NOTE — Progress Notes (Signed)
Subjective: Nicole Foley is a 42 y.o. female patient who presents to office for follow up evaluation of left ankle pain. Patient states meloxicam helped for the first week and a half giving her complete relief in all her joints.  States that she still has pain however it is more of a discomfort in her left foot and ankle more specifically along the side of her ankle and has still noticed a little bit with swelling states that symptoms are better but not 100%.  Patient denies any other pedal complaints. Denies nausea, vomiting, fever, chills or any other acute symptoms at this time.  Patient Active Problem List   Diagnosis Date Noted  . Allergic rhinitis 02/16/2015  . LPRD (laryngopharyngeal reflux disease) 02/16/2015  . Sinusitis 02/16/2015  . ASTHMA 02/08/2010  . Chronic rhinitis 01/01/2009  . Cough 01/01/2009  . GERD 12/31/2008    Current Outpatient Medications on File Prior to Visit  Medication Sig Dispense Refill  . Albuterol Sulfate (PROAIR HFA IN) Inhale into the lungs as needed.    . cetirizine (ZYRTEC) 10 MG chewable tablet Chew 10 mg by mouth daily.    . Cholecalciferol (VITAMIN D3) 2000 UNITS TABS Take 1 tablet by mouth daily.    Marland Kitchen EPINEPHrine (EPIPEN 2-PAK IJ) Inject as directed once as needed.    Marland Kitchen Fexofenadine HCl (ALLEGRA ALLERGY PO) Take by mouth as needed.    . fish oil-omega-3 fatty acids 1000 MG capsule Take 2 g by mouth daily.    Marland Kitchen levonorgestrel-ethinyl estradiol (SEASONALE,INTROVALE,JOLESSA) 0.15-0.03 MG tablet Take 1 tablet by mouth daily.    . meloxicam (MOBIC) 15 MG tablet Take 1 tablet (15 mg total) by mouth daily. 30 tablet 0  . mometasone (NASONEX) 50 MCG/ACT nasal spray Place 2 sprays into the nose daily.    . mometasone-formoterol (DULERA) 200-5 MCG/ACT AERO Inhale 2 puffs into the lungs 2 (two) times daily.    . montelukast (SINGULAIR) 10 MG tablet TAKE 1 TABLET BY MOUTH EVERY NIGHT AT BEDTIME 30 tablet 2  . Nutritional Supplements (JUICE PLUS FIBRE PO) Take 6  tablets by mouth daily.    Marland Kitchen omeprazole (PRILOSEC) 20 MG capsule Take 1 capsule (20 mg total) by mouth daily. 30 capsule 3  . pantoprazole (PROTONIX) 40 MG tablet Take 40 mg by mouth 2 (two) times daily.     No current facility-administered medications on file prior to visit.     No Known Allergies  Objective:  General: Alert and oriented x3 in no acute distress  Dermatology: No open lesions bilateral lower extremities, no webspace macerations, no ecchymosis bilateral, all nails x 10 are well manicured.  Vascular: Dorsalis Pedis and Posterior Tibial pedal pulses palpable, Capillary Fill Time 3 seconds,(+) pedal hair growth bilateral, mild trace edema to left lateral ankle slightly improved from prior, temperature gradient within normal limits.  Neurology: Johney Maine sensation intact via light touch bilateral. (- )Tinels sign bilateral.   Musculoskeletal: Mild tenderness with palpation at peroneal tendon course on the left lateral ankle. Negative talar tilt, Negative tib-fib stress, No instability. No pain with calf compression bilateral. Range of motion within normal limits with mild guarding on left ankle. Strength within normal limits in all groups bilateral.   Assessment and Plan: Problem List Items Addressed This Visit    None    Visit Diagnoses    Tendonitis    -  Primary   Left ankle swelling       Left foot pain       Enthesopathy  Relevant Orders   HLA B*[id number]   Uric Acid      -Complete examination performed -Discussed with patient tendonitis vs partial tear  -Rx MRI for eval of tendons -Rx Venous reflux testing for swelling  -Rx Diclofenac to take as instructed -Rx HLAB27 and Uric Acid since this has not been done on previous arthritic panel that was negative  -Continue with ankle gauntlet to use daily on left ankle and advised patient to avoid uneven surfaces  -Recommend protection rest ice elevation as instructed -Patient to return to office after studies have been  completed or sooner if condition worsens.  Landis Martins, DPM

## 2017-10-12 NOTE — Telephone Encounter (Signed)
Nicole Foley - [facility] scheduled 10/16/2017 arrive 4:00pm for 4:30pm MRI left ankle [id number]. [facility]. Mia took 7 minutes to take appt information.

## 2017-10-12 NOTE — Telephone Encounter (Signed)
I informed pt of 10/16/2017 arrive 4:00pm for 4:30pm MRI left ankle.

## 2017-10-16 DIAGNOSIS — M25572 Pain in left ankle and joints of left foot: Secondary | ICD-10-CM | POA: Diagnosis not present

## 2017-10-16 DIAGNOSIS — M7989 Other specified soft tissue disorders: Secondary | ICD-10-CM | POA: Diagnosis not present

## 2017-10-16 DIAGNOSIS — M779 Enthesopathy, unspecified: Secondary | ICD-10-CM | POA: Diagnosis not present

## 2017-10-17 ENCOUNTER — Encounter: Payer: Self-pay | Admitting: Sports Medicine

## 2017-10-19 ENCOUNTER — Telehealth: Payer: Self-pay | Admitting: Sports Medicine

## 2017-10-19 NOTE — Telephone Encounter (Signed)
I had an MRI done on Tuesday in [place] and they said it would be about 48 hours before they got the results. I'm assuming maybe they gave the results to you guys yesterday. I was just calling to see how my MRI turned out. Also, I had some labs done at [facility] in [place] on Tuesday as well. I'm not sure how long it will take to get those results back. You can reach me at [phone]. Thanks so much and have a great day.

## 2017-10-19 NOTE — Telephone Encounter (Signed)
I told Dr. Marylene Land would like her to make an appt to discuss MRI and Lab results. Transferred pt to schedulers.

## 2017-10-22 LAB — URIC ACID: Uric Acid: 5.2 mg/dL (ref 2.5–7.1)

## 2017-10-22 LAB — HLA B*5701: RESULT: NEGATIVE

## 2017-10-24 ENCOUNTER — Ambulatory Visit: Payer: BLUE CROSS/BLUE SHIELD | Admitting: Sports Medicine

## 2017-10-24 ENCOUNTER — Telehealth: Payer: Self-pay | Admitting: *Deleted

## 2017-10-24 DIAGNOSIS — R609 Edema, unspecified: Secondary | ICD-10-CM

## 2017-10-24 DIAGNOSIS — M25472 Effusion, left ankle: Secondary | ICD-10-CM

## 2017-10-24 DIAGNOSIS — M779 Enthesopathy, unspecified: Secondary | ICD-10-CM | POA: Diagnosis not present

## 2017-10-24 DIAGNOSIS — M79672 Pain in left foot: Secondary | ICD-10-CM

## 2017-10-24 DIAGNOSIS — M25571 Pain in right ankle and joints of right foot: Secondary | ICD-10-CM

## 2017-10-24 DIAGNOSIS — T148XXA Other injury of unspecified body region, initial encounter: Secondary | ICD-10-CM

## 2017-10-24 MED ORDER — IBUPROFEN 800 MG PO TABS: 800.0000 mg | ORAL_TABLET | Freq: Three times a day (TID) | ORAL | 1 refills | Status: DC | PRN

## 2017-10-24 NOTE — Telephone Encounter (Signed)
-----   Message from Asencion Islam, [place] sent at 10/24/2017  1:40 PM EDT ----- Regarding: MRI R foot and ankle R/o Peroneal tendon tear on right foot as well. Was on Levaquin and is now having pain on right ankle -Dr. Kathie Rhodes

## 2017-10-24 NOTE — Progress Notes (Addendum)
Subjective: Nicole Foley is a 42 y.o. female patient who presents to office for follow up evaluation of left ankle pain and MRI results. Patient states that she has not been able to take Diclofenac because she forgets. Still experiencing pain and swelling more now on Right than left. Patient is concerned for possible tendon tear on the right due to her history of Levaquin use. Patient denies any other pedal complaints. Denies nausea, vomiting, fever, chills or any other acute symptoms at this time.  Patient Active Problem List   Diagnosis Date Noted  . Allergic rhinitis 02/16/2015  . LPRD (laryngopharyngeal reflux disease) 02/16/2015  . Sinusitis 02/16/2015  . ASTHMA 02/08/2010  . Chronic rhinitis 01/01/2009  . Cough 01/01/2009  . GERD 12/31/2008    Current Outpatient Medications on File Prior to Visit  Medication Sig Dispense Refill  . Albuterol Sulfate (PROAIR HFA IN) Inhale into the lungs as needed.    . cetirizine (ZYRTEC) 10 MG chewable tablet Chew 10 mg by mouth daily.    . Cholecalciferol (VITAMIN D3) 2000 UNITS TABS Take 1 tablet by mouth daily.    . diclofenac (VOLTAREN) 75 MG EC tablet Take 1 tablet (75 mg total) by mouth 2 (two) times daily. 60 tablet 0  . EPINEPHrine (EPIPEN 2-PAK IJ) Inject as directed once as needed.    Marland Kitchen Fexofenadine HCl (ALLEGRA ALLERGY PO) Take by mouth as needed.    . fish oil-omega-3 fatty acids 1000 MG capsule Take 2 g by mouth daily.    Marland Kitchen levonorgestrel-ethinyl estradiol (SEASONALE,INTROVALE,JOLESSA) 0.15-0.03 MG tablet Take 1 tablet by mouth daily.    . meloxicam (MOBIC) 15 MG tablet Take 1 tablet (15 mg total) by mouth daily. 30 tablet 0  . mometasone (NASONEX) 50 MCG/ACT nasal spray Place 2 sprays into the nose daily.    . mometasone-formoterol (DULERA) 200-5 MCG/ACT AERO Inhale 2 puffs into the lungs 2 (two) times daily.    . montelukast (SINGULAIR) 10 MG tablet TAKE 1 TABLET BY MOUTH EVERY NIGHT AT BEDTIME 30 tablet 2  . Nutritional Supplements  (JUICE PLUS FIBRE PO) Take 6 tablets by mouth daily.    . pantoprazole (PROTONIX) 40 MG tablet Take 40 mg by mouth 2 (two) times daily.    Marland Kitchen omeprazole (PRILOSEC) 20 MG capsule Take 1 capsule (20 mg total) by mouth daily. 30 capsule 3   No current facility-administered medications on file prior to visit.     No Known Allergies  Objective:  General: Alert and oriented x3 in no acute distress  Dermatology: No open lesions bilateral lower extremities, no webspace macerations, no ecchymosis bilateral, all nails x 10 are well manicured.  Vascular: Dorsalis Pedis and Posterior Tibial pedal pulses palpable, Capillary Fill Time 3 seconds,(+) pedal hair growth bilateral, mild trace edema to left lateral ankle slightly improved from prior, temperature gradient within normal limits.  Neurology: Michaell Cowing sensation intact via light touch bilateral. (- )Tinels sign bilateral.   Musculoskeletal: Mild tenderness with palpation at peroneal tendon course on the right>left lateral ankle. Negative talar tilt, Negative tib-fib stress, No instability. No pain with calf compression bilateral. Range of motion within normal limits with mild guarding on left and right ankle. Strength within normal limits in all groups bilateral.   Assessment and Plan: Problem List Items Addressed This Visit    None    Visit Diagnoses    Tendon tear    -  Primary   Swelling       Tendonitis  Left ankle swelling       Left foot pain       Acute right ankle pain          -Complete examination performed -Discussed with patient tendonitis vs partial tear possibly on right -MRI reviewed of left revealing split tear of PB tendon  -Patient does not want surgery at this time and wants to try bracing and maybe PT and wants a MRI of Right so that way in future if she needs surgery can plan to get both done later in fall for possible tear as well on this side -Awaiting Venous reflux testing for swelling  -Rx Motrin to take as  instructed -Continue with ankle gauntlet to use daily on left and right ankle and advised patient to avoid uneven surfaces  -Recommend protection rest ice elevation as instructed -Patient to return to office after studies have been completed or sooner if condition worsens.  Addendum: Medical necessity for MRI of right ankle, patient is having increasing new onset right ankle pain with a history of use of flouroquinolones concerning for subsequent tendon tear/rupture.  This MRI is needed for surgical planning and failure to obtain MRI could lead to further injury or further disability.  Asencion Islam, DPM

## 2017-10-25 NOTE — Telephone Encounter (Signed)
[facility] states needs PEER TO PEER to evaluated [place] [id number] right ankle MRI, [phone], use pt's name, DOB and Member# to identify case for review.

## 2017-10-26 DIAGNOSIS — M7989 Other specified soft tissue disorders: Secondary | ICD-10-CM | POA: Diagnosis not present

## 2017-10-26 DIAGNOSIS — R609 Edema, unspecified: Secondary | ICD-10-CM | POA: Diagnosis not present

## 2017-10-29 ENCOUNTER — Encounter: Payer: Self-pay | Admitting: Sports Medicine

## 2017-10-30 NOTE — Telephone Encounter (Signed)
I will call in the morning when I have time

## 2017-10-31 NOTE — Telephone Encounter (Signed)
Just to give you an update, I called to do a PEER TO PEER. I left message for call back. I am waiting on [facility] to call me back so I can complete it the PEER to PEER. -Dr. Marylene Land

## 2017-11-01 ENCOUNTER — Telehealth: Payer: Self-pay | Admitting: Sports Medicine

## 2017-11-01 DIAGNOSIS — M25572 Pain in left ankle and joints of left foot: Secondary | ICD-10-CM | POA: Diagnosis not present

## 2017-11-01 DIAGNOSIS — M79672 Pain in left foot: Secondary | ICD-10-CM | POA: Diagnosis not present

## 2017-11-01 DIAGNOSIS — M25571 Pain in right ankle and joints of right foot: Secondary | ICD-10-CM | POA: Diagnosis not present

## 2017-11-01 DIAGNOSIS — M7671 Peroneal tendinitis, right leg: Secondary | ICD-10-CM | POA: Diagnosis not present

## 2017-11-01 DIAGNOSIS — M7672 Peroneal tendinitis, left leg: Secondary | ICD-10-CM | POA: Diagnosis not present

## 2017-11-01 DIAGNOSIS — M79671 Pain in right foot: Secondary | ICD-10-CM | POA: Diagnosis not present

## 2017-11-01 NOTE — Telephone Encounter (Signed)
Faxed clinicals and medical necessity note to [facility] Attn:  Appeals.

## 2017-11-01 NOTE — Telephone Encounter (Addendum)
Debbie -Lucia Bitterialty states case was closed on 10/29/2017, [employer] doesn't contract to perform PEER TO PEER for closed cases, will accept clinicals to review for pre-cert appeal. Eunice Blase states fax clinicals, and if available a physician's letter to [phone] before 11/05/2017.

## 2017-11-01 NOTE — Telephone Encounter (Signed)
Thanks

## 2017-11-01 NOTE — Telephone Encounter (Signed)
I saw Dr. Marylene Land last week and she was supposed to get me scheduled for an MRI. I have not heard anything and I was calling to check on that. I will be available to have an MRI done on 05/20, 05/21, 05/29, or 05/31. Please call me back as soon as possible at [phone]. Thank you.

## 2017-11-01 NOTE — Telephone Encounter (Signed)
Dictated medical necessity for MRI of right ankle on patient's last note from 10/24/2017 -Dr. Kathie Rhodes

## 2017-11-02 ENCOUNTER — Telehealth: Payer: Self-pay | Admitting: Sports Medicine

## 2017-11-02 NOTE — Telephone Encounter (Signed)
I left a message yesterday regarding an MRI. I was in to see Dr. Marylene Land over a week ago and she had ordered an MRI. I have not heard anything from anyone and I was wondering where we are at. My number is [phone]. Thank you. Bye bye.

## 2017-11-02 NOTE — Telephone Encounter (Signed)
Message answered in Telephone Call of 11/02/2017.

## 2017-11-02 NOTE — Telephone Encounter (Signed)
Nicole Foley - Record Coordinator emailed order for MRI with appt and [employer] authorization

## 2017-11-02 NOTE — Telephone Encounter (Signed)
Nicole Foley - [employer] states they have been able to over turn the case and has been authorized for 30 days from 11/02/2017, AUTHORIZATION #[id number].

## 2017-11-02 NOTE — Telephone Encounter (Signed)
[facility] scheduled pt for 11/05/2017 arrive at 2:00pm for 2:15pm MRI right ankle without contrast.

## 2017-11-02 NOTE — Telephone Encounter (Signed)
I informed pt and told her I would call or email the with the appt date at [facility] appt.

## 2017-11-02 NOTE — Telephone Encounter (Signed)
I informed pt the orders were sent for pre-cert once received then we were informed the case had been closed and could only be appealed before 11/05/2017 by sending clinicals again. I told her I had sent all clinicals required.

## 2017-11-05 DIAGNOSIS — M25571 Pain in right ankle and joints of right foot: Secondary | ICD-10-CM | POA: Diagnosis not present

## 2017-11-05 DIAGNOSIS — L239 Allergic contact dermatitis, unspecified cause: Secondary | ICD-10-CM | POA: Diagnosis not present

## 2017-11-07 DIAGNOSIS — M6281 Muscle weakness (generalized): Secondary | ICD-10-CM | POA: Diagnosis not present

## 2017-11-07 DIAGNOSIS — R2689 Other abnormalities of gait and mobility: Secondary | ICD-10-CM | POA: Diagnosis not present

## 2017-11-07 DIAGNOSIS — M79671 Pain in right foot: Secondary | ICD-10-CM | POA: Diagnosis not present

## 2017-11-07 DIAGNOSIS — M79672 Pain in left foot: Secondary | ICD-10-CM | POA: Diagnosis not present

## 2017-11-16 DIAGNOSIS — M79672 Pain in left foot: Secondary | ICD-10-CM | POA: Diagnosis not present

## 2017-11-16 DIAGNOSIS — M79671 Pain in right foot: Secondary | ICD-10-CM | POA: Diagnosis not present

## 2017-11-16 DIAGNOSIS — R2689 Other abnormalities of gait and mobility: Secondary | ICD-10-CM | POA: Diagnosis not present

## 2017-11-16 DIAGNOSIS — M6281 Muscle weakness (generalized): Secondary | ICD-10-CM | POA: Diagnosis not present

## 2017-11-20 DIAGNOSIS — M6281 Muscle weakness (generalized): Secondary | ICD-10-CM | POA: Diagnosis not present

## 2017-11-20 DIAGNOSIS — R2689 Other abnormalities of gait and mobility: Secondary | ICD-10-CM | POA: Diagnosis not present

## 2017-11-20 DIAGNOSIS — M79672 Pain in left foot: Secondary | ICD-10-CM | POA: Diagnosis not present

## 2017-11-20 DIAGNOSIS — M79671 Pain in right foot: Secondary | ICD-10-CM | POA: Diagnosis not present

## 2017-11-22 ENCOUNTER — Other Ambulatory Visit: Payer: BLUE CROSS/BLUE SHIELD

## 2017-11-23 DIAGNOSIS — M6281 Muscle weakness (generalized): Secondary | ICD-10-CM | POA: Diagnosis not present

## 2017-11-23 DIAGNOSIS — M79672 Pain in left foot: Secondary | ICD-10-CM | POA: Diagnosis not present

## 2017-11-23 DIAGNOSIS — M79671 Pain in right foot: Secondary | ICD-10-CM | POA: Diagnosis not present

## 2017-11-23 DIAGNOSIS — R2689 Other abnormalities of gait and mobility: Secondary | ICD-10-CM | POA: Diagnosis not present

## 2017-11-27 DIAGNOSIS — M79671 Pain in right foot: Secondary | ICD-10-CM | POA: Diagnosis not present

## 2017-11-27 DIAGNOSIS — R2689 Other abnormalities of gait and mobility: Secondary | ICD-10-CM | POA: Diagnosis not present

## 2017-11-27 DIAGNOSIS — M6281 Muscle weakness (generalized): Secondary | ICD-10-CM | POA: Diagnosis not present

## 2017-11-27 DIAGNOSIS — M79672 Pain in left foot: Secondary | ICD-10-CM | POA: Diagnosis not present

## 2017-11-30 DIAGNOSIS — M79671 Pain in right foot: Secondary | ICD-10-CM | POA: Diagnosis not present

## 2017-11-30 DIAGNOSIS — M79672 Pain in left foot: Secondary | ICD-10-CM | POA: Diagnosis not present

## 2017-11-30 DIAGNOSIS — M6281 Muscle weakness (generalized): Secondary | ICD-10-CM | POA: Diagnosis not present

## 2017-11-30 DIAGNOSIS — R2689 Other abnormalities of gait and mobility: Secondary | ICD-10-CM | POA: Diagnosis not present

## 2017-12-04 DIAGNOSIS — R2689 Other abnormalities of gait and mobility: Secondary | ICD-10-CM | POA: Diagnosis not present

## 2017-12-04 DIAGNOSIS — M6281 Muscle weakness (generalized): Secondary | ICD-10-CM | POA: Diagnosis not present

## 2017-12-04 DIAGNOSIS — M79671 Pain in right foot: Secondary | ICD-10-CM | POA: Diagnosis not present

## 2017-12-04 DIAGNOSIS — M79672 Pain in left foot: Secondary | ICD-10-CM | POA: Diagnosis not present

## 2017-12-07 DIAGNOSIS — R2689 Other abnormalities of gait and mobility: Secondary | ICD-10-CM | POA: Diagnosis not present

## 2017-12-07 DIAGNOSIS — M6281 Muscle weakness (generalized): Secondary | ICD-10-CM | POA: Diagnosis not present

## 2017-12-07 DIAGNOSIS — M79671 Pain in right foot: Secondary | ICD-10-CM | POA: Diagnosis not present

## 2017-12-07 DIAGNOSIS — M79672 Pain in left foot: Secondary | ICD-10-CM | POA: Diagnosis not present

## 2017-12-11 DIAGNOSIS — R2689 Other abnormalities of gait and mobility: Secondary | ICD-10-CM | POA: Diagnosis not present

## 2017-12-11 DIAGNOSIS — M79671 Pain in right foot: Secondary | ICD-10-CM | POA: Diagnosis not present

## 2017-12-11 DIAGNOSIS — M79672 Pain in left foot: Secondary | ICD-10-CM | POA: Diagnosis not present

## 2017-12-13 DIAGNOSIS — M7671 Peroneal tendinitis, right leg: Secondary | ICD-10-CM | POA: Diagnosis not present

## 2017-12-13 DIAGNOSIS — M7672 Peroneal tendinitis, left leg: Secondary | ICD-10-CM | POA: Diagnosis not present

## 2017-12-14 DIAGNOSIS — M79671 Pain in right foot: Secondary | ICD-10-CM | POA: Diagnosis not present

## 2017-12-14 DIAGNOSIS — M79672 Pain in left foot: Secondary | ICD-10-CM | POA: Diagnosis not present

## 2017-12-14 DIAGNOSIS — R2689 Other abnormalities of gait and mobility: Secondary | ICD-10-CM | POA: Diagnosis not present

## 2017-12-17 DIAGNOSIS — M79672 Pain in left foot: Secondary | ICD-10-CM | POA: Diagnosis not present

## 2017-12-17 DIAGNOSIS — M6281 Muscle weakness (generalized): Secondary | ICD-10-CM | POA: Diagnosis not present

## 2017-12-17 DIAGNOSIS — M79671 Pain in right foot: Secondary | ICD-10-CM | POA: Diagnosis not present

## 2017-12-17 DIAGNOSIS — R2689 Other abnormalities of gait and mobility: Secondary | ICD-10-CM | POA: Diagnosis not present

## 2017-12-24 DIAGNOSIS — M79671 Pain in right foot: Secondary | ICD-10-CM | POA: Diagnosis not present

## 2017-12-24 DIAGNOSIS — R2689 Other abnormalities of gait and mobility: Secondary | ICD-10-CM | POA: Diagnosis not present

## 2017-12-24 DIAGNOSIS — M6281 Muscle weakness (generalized): Secondary | ICD-10-CM | POA: Diagnosis not present

## 2017-12-24 DIAGNOSIS — M79672 Pain in left foot: Secondary | ICD-10-CM | POA: Diagnosis not present

## 2017-12-31 DIAGNOSIS — M79672 Pain in left foot: Secondary | ICD-10-CM | POA: Diagnosis not present

## 2017-12-31 DIAGNOSIS — M6281 Muscle weakness (generalized): Secondary | ICD-10-CM | POA: Diagnosis not present

## 2017-12-31 DIAGNOSIS — M79671 Pain in right foot: Secondary | ICD-10-CM | POA: Diagnosis not present

## 2017-12-31 DIAGNOSIS — R2689 Other abnormalities of gait and mobility: Secondary | ICD-10-CM | POA: Diagnosis not present

## 2018-01-03 DIAGNOSIS — R2689 Other abnormalities of gait and mobility: Secondary | ICD-10-CM | POA: Diagnosis not present

## 2018-01-03 DIAGNOSIS — M79672 Pain in left foot: Secondary | ICD-10-CM | POA: Diagnosis not present

## 2018-01-03 DIAGNOSIS — M6281 Muscle weakness (generalized): Secondary | ICD-10-CM | POA: Diagnosis not present

## 2018-01-07 DIAGNOSIS — M79672 Pain in left foot: Secondary | ICD-10-CM | POA: Diagnosis not present

## 2018-01-07 DIAGNOSIS — R2689 Other abnormalities of gait and mobility: Secondary | ICD-10-CM | POA: Diagnosis not present

## 2018-01-07 DIAGNOSIS — M6281 Muscle weakness (generalized): Secondary | ICD-10-CM | POA: Diagnosis not present

## 2018-01-09 DIAGNOSIS — M6281 Muscle weakness (generalized): Secondary | ICD-10-CM | POA: Diagnosis not present

## 2018-01-09 DIAGNOSIS — R2689 Other abnormalities of gait and mobility: Secondary | ICD-10-CM | POA: Diagnosis not present

## 2018-01-09 DIAGNOSIS — M79672 Pain in left foot: Secondary | ICD-10-CM | POA: Diagnosis not present

## 2018-01-09 DIAGNOSIS — M79671 Pain in right foot: Secondary | ICD-10-CM | POA: Diagnosis not present

## 2018-01-16 DIAGNOSIS — M6281 Muscle weakness (generalized): Secondary | ICD-10-CM | POA: Diagnosis not present

## 2018-01-16 DIAGNOSIS — M79671 Pain in right foot: Secondary | ICD-10-CM | POA: Diagnosis not present

## 2018-01-16 DIAGNOSIS — M79672 Pain in left foot: Secondary | ICD-10-CM | POA: Diagnosis not present

## 2018-01-17 DIAGNOSIS — L300 Nummular dermatitis: Secondary | ICD-10-CM | POA: Diagnosis not present

## 2018-01-24 DIAGNOSIS — M79671 Pain in right foot: Secondary | ICD-10-CM | POA: Diagnosis not present

## 2018-01-24 DIAGNOSIS — M6281 Muscle weakness (generalized): Secondary | ICD-10-CM | POA: Diagnosis not present

## 2018-01-24 DIAGNOSIS — M79672 Pain in left foot: Secondary | ICD-10-CM | POA: Diagnosis not present

## 2018-02-01 DIAGNOSIS — M6281 Muscle weakness (generalized): Secondary | ICD-10-CM | POA: Diagnosis not present

## 2018-02-01 DIAGNOSIS — R2689 Other abnormalities of gait and mobility: Secondary | ICD-10-CM | POA: Diagnosis not present

## 2018-02-01 DIAGNOSIS — M79671 Pain in right foot: Secondary | ICD-10-CM | POA: Diagnosis not present

## 2018-02-01 DIAGNOSIS — M79672 Pain in left foot: Secondary | ICD-10-CM | POA: Diagnosis not present

## 2018-02-07 DIAGNOSIS — R2689 Other abnormalities of gait and mobility: Secondary | ICD-10-CM | POA: Diagnosis not present

## 2018-02-07 DIAGNOSIS — M79671 Pain in right foot: Secondary | ICD-10-CM | POA: Diagnosis not present

## 2018-02-07 DIAGNOSIS — M6281 Muscle weakness (generalized): Secondary | ICD-10-CM | POA: Diagnosis not present

## 2018-02-07 DIAGNOSIS — M79672 Pain in left foot: Secondary | ICD-10-CM | POA: Diagnosis not present

## 2018-02-25 DIAGNOSIS — J3089 Other allergic rhinitis: Secondary | ICD-10-CM | POA: Diagnosis not present

## 2018-02-25 DIAGNOSIS — J4520 Mild intermittent asthma, uncomplicated: Secondary | ICD-10-CM | POA: Diagnosis not present

## 2018-03-07 DIAGNOSIS — M7671 Peroneal tendinitis, right leg: Secondary | ICD-10-CM | POA: Diagnosis not present

## 2018-03-07 DIAGNOSIS — M7672 Peroneal tendinitis, left leg: Secondary | ICD-10-CM | POA: Diagnosis not present

## 2018-03-07 DIAGNOSIS — M6281 Muscle weakness (generalized): Secondary | ICD-10-CM | POA: Diagnosis not present

## 2018-03-07 DIAGNOSIS — M79671 Pain in right foot: Secondary | ICD-10-CM | POA: Diagnosis not present

## 2018-03-07 DIAGNOSIS — S86301D Unspecified injury of muscle(s) and tendon(s) of peroneal muscle group at lower leg level, right leg, subsequent encounter: Secondary | ICD-10-CM | POA: Diagnosis not present

## 2018-03-07 DIAGNOSIS — M79672 Pain in left foot: Secondary | ICD-10-CM | POA: Diagnosis not present

## 2018-03-07 DIAGNOSIS — S86302D Unspecified injury of muscle(s) and tendon(s) of peroneal muscle group at lower leg level, left leg, subsequent encounter: Secondary | ICD-10-CM | POA: Diagnosis not present

## 2018-04-01 DIAGNOSIS — M7989 Other specified soft tissue disorders: Secondary | ICD-10-CM | POA: Diagnosis not present

## 2018-04-01 DIAGNOSIS — M25571 Pain in right ankle and joints of right foot: Secondary | ICD-10-CM | POA: Diagnosis not present

## 2018-04-01 DIAGNOSIS — M7672 Peroneal tendinitis, left leg: Secondary | ICD-10-CM | POA: Diagnosis not present

## 2018-04-01 DIAGNOSIS — S86302D Unspecified injury of muscle(s) and tendon(s) of peroneal muscle group at lower leg level, left leg, subsequent encounter: Secondary | ICD-10-CM | POA: Diagnosis not present

## 2018-04-01 DIAGNOSIS — S86312A Strain of muscle(s) and tendon(s) of peroneal muscle group at lower leg level, left leg, initial encounter: Secondary | ICD-10-CM | POA: Diagnosis not present

## 2018-04-01 DIAGNOSIS — M25572 Pain in left ankle and joints of left foot: Secondary | ICD-10-CM | POA: Diagnosis not present

## 2018-04-01 DIAGNOSIS — M7671 Peroneal tendinitis, right leg: Secondary | ICD-10-CM | POA: Diagnosis not present

## 2018-04-01 DIAGNOSIS — S86301D Unspecified injury of muscle(s) and tendon(s) of peroneal muscle group at lower leg level, right leg, subsequent encounter: Secondary | ICD-10-CM | POA: Diagnosis not present

## 2018-04-01 DIAGNOSIS — Y33XXXA Other specified events, undetermined intent, initial encounter: Secondary | ICD-10-CM | POA: Diagnosis not present

## 2018-04-01 DIAGNOSIS — X58XXXD Exposure to other specified factors, subsequent encounter: Secondary | ICD-10-CM | POA: Diagnosis not present

## 2018-04-07 DIAGNOSIS — J0140 Acute pansinusitis, unspecified: Secondary | ICD-10-CM | POA: Diagnosis not present

## 2018-05-21 DIAGNOSIS — Z6833 Body mass index (BMI) 33.0-33.9, adult: Secondary | ICD-10-CM | POA: Diagnosis not present

## 2018-05-21 DIAGNOSIS — Z7189 Other specified counseling: Secondary | ICD-10-CM | POA: Diagnosis not present

## 2018-05-21 DIAGNOSIS — Z789 Other specified health status: Secondary | ICD-10-CM | POA: Diagnosis not present

## 2018-06-03 DIAGNOSIS — J0140 Acute pansinusitis, unspecified: Secondary | ICD-10-CM | POA: Diagnosis not present

## 2018-06-05 DIAGNOSIS — Z885 Allergy status to narcotic agent status: Secondary | ICD-10-CM | POA: Diagnosis not present

## 2018-06-05 DIAGNOSIS — Z79899 Other long term (current) drug therapy: Secondary | ICD-10-CM | POA: Diagnosis not present

## 2018-06-05 DIAGNOSIS — M7672 Peroneal tendinitis, left leg: Secondary | ICD-10-CM | POA: Diagnosis not present

## 2018-06-05 DIAGNOSIS — K219 Gastro-esophageal reflux disease without esophagitis: Secondary | ICD-10-CM | POA: Diagnosis not present

## 2018-06-05 DIAGNOSIS — J45909 Unspecified asthma, uncomplicated: Secondary | ICD-10-CM | POA: Diagnosis not present

## 2018-06-05 DIAGNOSIS — M5020 Other cervical disc displacement, unspecified cervical region: Secondary | ICD-10-CM | POA: Diagnosis not present

## 2018-06-05 DIAGNOSIS — M216X2 Other acquired deformities of left foot: Secondary | ICD-10-CM | POA: Diagnosis not present

## 2018-06-05 DIAGNOSIS — M66372 Spontaneous rupture of flexor tendons, left ankle and foot: Secondary | ICD-10-CM | POA: Diagnosis not present

## 2018-06-05 DIAGNOSIS — M21172 Varus deformity, not elsewhere classified, left ankle: Secondary | ICD-10-CM | POA: Diagnosis not present

## 2018-06-05 DIAGNOSIS — F419 Anxiety disorder, unspecified: Secondary | ICD-10-CM | POA: Diagnosis not present

## 2018-06-06 DIAGNOSIS — Z885 Allergy status to narcotic agent status: Secondary | ICD-10-CM | POA: Diagnosis not present

## 2018-06-06 DIAGNOSIS — M21172 Varus deformity, not elsewhere classified, left ankle: Secondary | ICD-10-CM | POA: Diagnosis not present

## 2018-06-06 DIAGNOSIS — K219 Gastro-esophageal reflux disease without esophagitis: Secondary | ICD-10-CM | POA: Diagnosis not present

## 2018-06-06 DIAGNOSIS — J45909 Unspecified asthma, uncomplicated: Secondary | ICD-10-CM | POA: Diagnosis not present

## 2018-06-06 DIAGNOSIS — Z79899 Other long term (current) drug therapy: Secondary | ICD-10-CM | POA: Diagnosis not present

## 2018-06-06 DIAGNOSIS — F419 Anxiety disorder, unspecified: Secondary | ICD-10-CM | POA: Diagnosis not present

## 2018-06-06 DIAGNOSIS — M7672 Peroneal tendinitis, left leg: Secondary | ICD-10-CM | POA: Diagnosis not present

## 2018-06-06 DIAGNOSIS — M5020 Other cervical disc displacement, unspecified cervical region: Secondary | ICD-10-CM | POA: Diagnosis not present

## 2018-06-14 DIAGNOSIS — S86302D Unspecified injury of muscle(s) and tendon(s) of peroneal muscle group at lower leg level, left leg, subsequent encounter: Secondary | ICD-10-CM | POA: Diagnosis not present

## 2018-06-20 DIAGNOSIS — Z4789 Encounter for other orthopedic aftercare: Secondary | ICD-10-CM | POA: Diagnosis not present

## 2018-07-01 DIAGNOSIS — Z4789 Encounter for other orthopedic aftercare: Secondary | ICD-10-CM | POA: Diagnosis not present

## 2018-07-15 DIAGNOSIS — Z4789 Encounter for other orthopedic aftercare: Secondary | ICD-10-CM | POA: Diagnosis not present

## 2018-07-25 DIAGNOSIS — Z4789 Encounter for other orthopedic aftercare: Secondary | ICD-10-CM | POA: Diagnosis not present

## 2018-08-19 DIAGNOSIS — M79672 Pain in left foot: Secondary | ICD-10-CM | POA: Diagnosis not present

## 2018-08-19 DIAGNOSIS — M7989 Other specified soft tissue disorders: Secondary | ICD-10-CM | POA: Diagnosis not present

## 2018-08-22 DIAGNOSIS — M25572 Pain in left ankle and joints of left foot: Secondary | ICD-10-CM | POA: Diagnosis not present

## 2018-08-22 DIAGNOSIS — R2689 Other abnormalities of gait and mobility: Secondary | ICD-10-CM | POA: Diagnosis not present

## 2018-08-22 DIAGNOSIS — M25672 Stiffness of left ankle, not elsewhere classified: Secondary | ICD-10-CM | POA: Diagnosis not present

## 2018-08-28 DIAGNOSIS — M62830 Muscle spasm of back: Secondary | ICD-10-CM | POA: Diagnosis not present

## 2018-08-28 DIAGNOSIS — K649 Unspecified hemorrhoids: Secondary | ICD-10-CM | POA: Diagnosis not present

## 2018-09-03 DIAGNOSIS — M25572 Pain in left ankle and joints of left foot: Secondary | ICD-10-CM | POA: Diagnosis not present

## 2018-09-03 DIAGNOSIS — R2689 Other abnormalities of gait and mobility: Secondary | ICD-10-CM | POA: Diagnosis not present

## 2018-09-03 DIAGNOSIS — M25672 Stiffness of left ankle, not elsewhere classified: Secondary | ICD-10-CM | POA: Diagnosis not present

## 2018-09-05 DIAGNOSIS — M25572 Pain in left ankle and joints of left foot: Secondary | ICD-10-CM | POA: Diagnosis not present

## 2018-09-05 DIAGNOSIS — M25672 Stiffness of left ankle, not elsewhere classified: Secondary | ICD-10-CM | POA: Diagnosis not present

## 2018-09-05 DIAGNOSIS — R2689 Other abnormalities of gait and mobility: Secondary | ICD-10-CM | POA: Diagnosis not present

## 2018-09-10 DIAGNOSIS — M25672 Stiffness of left ankle, not elsewhere classified: Secondary | ICD-10-CM | POA: Diagnosis not present

## 2018-09-10 DIAGNOSIS — M25572 Pain in left ankle and joints of left foot: Secondary | ICD-10-CM | POA: Diagnosis not present

## 2018-09-10 DIAGNOSIS — R2689 Other abnormalities of gait and mobility: Secondary | ICD-10-CM | POA: Diagnosis not present

## 2018-09-13 DIAGNOSIS — R2689 Other abnormalities of gait and mobility: Secondary | ICD-10-CM | POA: Diagnosis not present

## 2018-09-13 DIAGNOSIS — M25572 Pain in left ankle and joints of left foot: Secondary | ICD-10-CM | POA: Diagnosis not present

## 2018-09-13 DIAGNOSIS — M25672 Stiffness of left ankle, not elsewhere classified: Secondary | ICD-10-CM | POA: Diagnosis not present

## 2018-09-16 DIAGNOSIS — M25672 Stiffness of left ankle, not elsewhere classified: Secondary | ICD-10-CM | POA: Diagnosis not present

## 2018-09-16 DIAGNOSIS — M25572 Pain in left ankle and joints of left foot: Secondary | ICD-10-CM | POA: Diagnosis not present

## 2018-09-16 DIAGNOSIS — R2689 Other abnormalities of gait and mobility: Secondary | ICD-10-CM | POA: Diagnosis not present

## 2018-09-19 DIAGNOSIS — R2689 Other abnormalities of gait and mobility: Secondary | ICD-10-CM | POA: Diagnosis not present

## 2018-09-19 DIAGNOSIS — M25572 Pain in left ankle and joints of left foot: Secondary | ICD-10-CM | POA: Diagnosis not present

## 2018-09-19 DIAGNOSIS — M25672 Stiffness of left ankle, not elsewhere classified: Secondary | ICD-10-CM | POA: Diagnosis not present

## 2018-12-25 DIAGNOSIS — Z6835 Body mass index (BMI) 35.0-35.9, adult: Secondary | ICD-10-CM | POA: Diagnosis not present

## 2018-12-25 DIAGNOSIS — Z1331 Encounter for screening for depression: Secondary | ICD-10-CM | POA: Diagnosis not present

## 2018-12-25 DIAGNOSIS — Z124 Encounter for screening for malignant neoplasm of cervix: Secondary | ICD-10-CM | POA: Diagnosis not present

## 2018-12-25 DIAGNOSIS — Z0000 Encounter for general adult medical examination without abnormal findings: Secondary | ICD-10-CM | POA: Diagnosis not present

## 2018-12-30 DIAGNOSIS — R946 Abnormal results of thyroid function studies: Secondary | ICD-10-CM | POA: Diagnosis not present

## 2018-12-30 DIAGNOSIS — Z1159 Encounter for screening for other viral diseases: Secondary | ICD-10-CM | POA: Diagnosis not present

## 2019-01-28 ENCOUNTER — Other Ambulatory Visit: Payer: Self-pay

## 2019-01-28 ENCOUNTER — Telehealth: Payer: Self-pay | Admitting: Internal Medicine

## 2019-01-28 ENCOUNTER — Encounter: Payer: Self-pay | Admitting: Internal Medicine

## 2019-01-28 ENCOUNTER — Ambulatory Visit: Payer: Self-pay | Admitting: Internal Medicine

## 2019-01-28 VITALS — BP 122/78 | HR 78 | Temp 98.9°F | Ht 67.0 in | Wt 224.6 lb

## 2019-01-28 DIAGNOSIS — E063 Autoimmune thyroiditis: Secondary | ICD-10-CM

## 2019-01-28 DIAGNOSIS — E0590 Thyrotoxicosis, unspecified without thyrotoxic crisis or storm: Secondary | ICD-10-CM

## 2019-01-28 LAB — TSH: TSH: 5.95 u[IU]/mL — ABNORMAL HIGH (ref 0.35–4.50)

## 2019-01-28 LAB — T4, FREE: Free T4: 0.61 ng/dL (ref 0.60–1.60)

## 2019-01-28 NOTE — Progress Notes (Signed)
Name: Nicole Foley  MRN/ DOB: [id number], [birth year]    Age/ Sex: 43 y.o., female    PCP: Philemon KingdomProchnau, Caroline, MD   Reason for Endocrinology Evaluation: Hashimoto's thyroid Disease      Date of Initial Endocrinology Evaluation: 01/29/2019     HPI: Nicole Foley is a 43 y.o. female with unremarkable past medical history . The patient presented for initial endocrinology clinic visit on 01/29/2019 for consultative assistance with her Elevated Anti-TPO Antibodies   She has chronic symptoms of  Fatigue, joint pain, irregular periods, dry hair , palpitations  and inability to lose weight. This has been going on for years. Denies depression and anxiety.    Pt was noted to have abnormal TFT's during routine lab draw with a TSH 0.094 uIU/mL in 12/2018. She was also found to have elevated TPO Ab's at 262 IU/mL  No biotin   Father with DM   HISTORY:  Past Medical History:  Past Medical History:  Diagnosis Date  . ASTHMA 02/08/2010  . Chronic cough   . Chronic rhinitis   . GERD (gastroesophageal reflux disease)   . TMJ (temporomandibular joint syndrome)    Past Surgical History:  Past Surgical History:  Procedure Laterality Date  . BREAST ENHANCEMENT SURGERY        Social History:  reports that she has never smoked. She does not have any smokeless tobacco history on file.  Family History: family history includes Diabetes in her father and maternal grandmother; Heart failure in her father.   HOME MEDICATIONS: Allergies as of 01/28/2019   No Known Allergies     Medication List       Accurate as of January 28, 2019 11:59 PM. If you have any questions, ask your nurse or doctor.        ALLEGRA ALLERGY PO Take by mouth as needed.   cetirizine 10 MG chewable tablet Commonly known as: ZYRTEC Chew 10 mg by mouth daily.   diclofenac 75 MG EC tablet Commonly known as: VOLTAREN Take 1 tablet (75 mg total) by mouth 2 (two) times daily.   Dulera 200-5 MCG/ACT Aero Generic  drug: mometasone-formoterol Inhale 2 puffs into the lungs 2 (two) times daily.   EPIPEN 2-PAK IJ Inject as directed once as needed.   fish oil-omega-3 fatty acids 1000 MG capsule Take 2 g by mouth daily.   ibuprofen 800 MG tablet Commonly known as: ADVIL Take 1 tablet (800 mg total) by mouth every 8 (eight) hours as needed.   JUICE PLUS FIBRE PO Take 6 tablets by mouth daily.   levonorgestrel-ethinyl estradiol 0.15-0.03 MG tablet Commonly known as: SEASONALE Take 1 tablet by mouth daily.   levothyroxine 50 MCG tablet Commonly known as: SYNTHROID Take 1 tablet (50 mcg total) by mouth daily. Started by: Scarlette ShortsIbtehal J Velecia Ovitt, MD   meloxicam 15 MG tablet Commonly known as: MOBIC Take 1 tablet (15 mg total) by mouth daily.   mometasone 50 MCG/ACT nasal spray Commonly known as: NASONEX Place 2 sprays into the nose daily.   montelukast 10 MG tablet Commonly known as: SINGULAIR TAKE 1 TABLET BY MOUTH EVERY NIGHT AT BEDTIME   NON FORMULARY daily. Allergy sublingual drops   omeprazole 20 MG capsule Commonly known as: PRILOSEC Take 1 capsule (20 mg total) by mouth daily.   pantoprazole 40 MG tablet Commonly known as: PROTONIX Take 40 mg by mouth 2 (two) times daily.   PROAIR HFA IN Inhale into the lungs as needed.   Vitamin D3  50 MCG (2000 UT) Tabs Take 1 tablet by mouth daily.         REVIEW OF SYSTEMS: A comprehensive ROS was conducted with the patient and is negative except as per HPI and below:  Review of Systems  Constitutional: Negative for fever.  HENT: Negative for congestion and sore throat.   Respiratory: Negative for cough and shortness of breath.   Cardiovascular: Positive for palpitations. Negative for chest pain.  Gastrointestinal: Positive for constipation. Negative for diarrhea and nausea.  Genitourinary: Positive for frequency.  Neurological: Positive for tingling. Negative for tremors.  Endo/Heme/Allergies: Negative for polydipsia.   Psychiatric/Behavioral: Negative for depression. The patient is not nervous/anxious.        OBJECTIVE:  VS: BP 122/78 (BP Location: Left Arm, Patient Position: Sitting, Cuff Size: Large)   Pulse 78   Temp 98.9 F (37.2 C)   Ht 5\' 7"  (1.702 m)   Wt 224 lb 9.6 oz (101.9 kg)   SpO2 98%   BMI 35.18 kg/m    Wt Readings from Last 3 Encounters:  01/28/19 224 lb 9.6 oz (101.9 kg)  08/01/11 192 lb 9.6 oz (87.4 kg)  07/20/10 185 lb 2.1 oz (84 kg)     EXAM: General: Pt appears well and is in NAD  Hydration: Well-hydrated with moist mucous membranes and good skin turgor  Eyes: External eye exam normal without stare, lid lag or exophthalmos.  EOM intact.   Ears, Nose, Throat: Hearing: Grossly intact bilaterally Dental: Good dentition  Throat: Clear without mass, erythema or exudate  Neck: General: Supple without adenopathy. Thyroid: Thyroid size normal.  No goiter or nodules appreciated. No thyroid bruit.  Lungs: Clear with good BS bilat with no rales, rhonchi, or wheezes  Heart: Auscultation: RRR.  Abdomen: Normoactive bowel sounds, soft, nontender, without masses or organomegaly palpable  Extremities:  BL LE: No pretibial edema normal ROM and strength.  Skin: Hair: Texture and amount normal with gender appropriate distribution Skin Inspection: No rashes, acanthosis nigricans/skin tags. No lipohypertrophy Skin Palpation: Skin temperature, texture, and thickness normal to palpation  Neuro: Cranial nerves: II - XII grossly intact  Cerebellar: Normal coordination and movement; no tremor Motor: Normal strength throughout DTRs: 2+ and symmetric in UE without delay in relaxation phase  Mental Status: Judgment, insight: Intact Orientation: Oriented to time, place, and person Memory: Intact for recent and remote events Mood and affect: No depression, anxiety, or agitation     DATA REVIEWED: 12/27/18  TSH 0.094 uIU/mL   Anti-TPO Ab 262 IU/mL ( 0-34)  Results for Nicole, Foley (MRN  [id number]) as of 01/29/2019 11:09  Ref. Range 01/28/2019 14:55  TSH Latest Ref Range: 0.35 - 4.50 uIU/mL 5.95 (H)  Triiodothyronine (T3) Latest Ref Range: 76 - 181 ng/dL 98  T4,Free(Direct) Latest Ref Range: 0.60 - 1.60 ng/dL 0.61   ASSESSMENT/PLAN/RECOMMENDATIONS:   1. Low TSH :  - Repeat testing today shows elevated TSH, I suspect there was a lab interference as her symptoms at the time were not consistent with low TSH or hyperthyroidism  2. Hashimoto's Disease:   - Pt with multiple non-specific symptoms that could be attributed to thyroid disease, we did discuss that based on literature  pts with hashimoto's thyroid disease " tend" to feel better with a TSH between 2-3 uIU/mL  - Based on her elevated TSH and low normal FT4, we have agreed to start LT-4 replacement.  - Pt educated extensively on the correct way to take levothyroxine (first thing in the morning with  water, 30 minutes before eating or taking other medications). - Pt encouraged to double dose the following day if she were to miss a dose given long half-life of levothyroxine.  Medications :  Levothyroxine 50 mcg daily   3. Fatigue:   - This could be related to her thyroid but given the chronicity of her symptoms and the recent abnormality of her thyroid function makes concerned that she has other causes other then just thyroid.  - Given her weight,and  reports of snoring I would recommend screening her for OSA as she is at high risk due to obesity - We discussed the cardiovascular risk and HTn with untreated OSA if present, will defer this to her PCP    Labs in 6 weeks   F/u 3 months   Signed electronically by: Lyndle HerrlichAbby Jaralla Markevius Trombetta, MD  [facility]  [facility] [address] [place], [place] [address] Phone: [phone] FAX: [phone]   CC: Philemon KingdomProchnau, Caroline, MD [address] [place] [address] Phone: [phone] Fax: [phone]   Return to Endocrinology  clinic as below: Future Appointments  Date Time Provider Department Center  03/10/2019 11:30 AM [facility] None  05/01/2019 11:10 AM Larine Fielding, Konrad DoloresIbtehal Jaralla, MD [facility] None

## 2019-01-28 NOTE — Telephone Encounter (Signed)
Left a message for a call back   Abby Jaralla Jeydan Barner, MD  [facility]  [facility] [address] [place], [place] [address] Phone: [phone] FAX: [phone]  

## 2019-01-28 NOTE — Patient Instructions (Signed)
-   Please stop by the lab today and again in 6 weeks.

## 2019-01-29 ENCOUNTER — Encounter: Payer: Self-pay | Admitting: Internal Medicine

## 2019-01-29 MED ORDER — LEVOTHYROXINE SODIUM 50 MCG PO TABS: 50.0000 ug | ORAL_TABLET | Freq: Every day | ORAL | 3 refills | Status: DC

## 2019-01-30 DIAGNOSIS — Z4789 Encounter for other orthopedic aftercare: Secondary | ICD-10-CM | POA: Diagnosis not present

## 2019-01-30 DIAGNOSIS — M216X2 Other acquired deformities of left foot: Secondary | ICD-10-CM | POA: Diagnosis not present

## 2019-01-30 DIAGNOSIS — M216X1 Other acquired deformities of right foot: Secondary | ICD-10-CM | POA: Diagnosis not present

## 2019-01-30 DIAGNOSIS — M79672 Pain in left foot: Secondary | ICD-10-CM | POA: Diagnosis not present

## 2019-01-30 DIAGNOSIS — T8484XA Pain due to internal orthopedic prosthetic devices, implants and grafts, initial encounter: Secondary | ICD-10-CM | POA: Diagnosis not present

## 2019-01-30 LAB — T3: T3, Total: 98 ng/dL (ref 76–181)

## 2019-01-30 LAB — TRAB (TSH RECEPTOR BINDING ANTIBODY): TRAB: 1.2 IU/L (ref <=2.00)

## 2019-02-04 DIAGNOSIS — R748 Abnormal levels of other serum enzymes: Secondary | ICD-10-CM | POA: Diagnosis not present

## 2019-02-04 DIAGNOSIS — N926 Irregular menstruation, unspecified: Secondary | ICD-10-CM | POA: Diagnosis not present

## 2019-02-04 DIAGNOSIS — N839 Noninflammatory disorder of ovary, fallopian tube and broad ligament, unspecified: Secondary | ICD-10-CM | POA: Diagnosis not present

## 2019-02-04 DIAGNOSIS — R7989 Other specified abnormal findings of blood chemistry: Secondary | ICD-10-CM | POA: Diagnosis not present

## 2019-02-10 ENCOUNTER — Encounter: Payer: Self-pay | Admitting: Internal Medicine

## 2019-02-10 ENCOUNTER — Ambulatory Visit: Payer: BC Managed Care – PPO | Admitting: Internal Medicine

## 2019-02-10 ENCOUNTER — Other Ambulatory Visit: Payer: Self-pay

## 2019-02-10 VITALS — BP 110/70 | HR 66 | Temp 98.0°F | Ht 67.0 in | Wt 224.2 lb

## 2019-02-10 DIAGNOSIS — G4733 Obstructive sleep apnea (adult) (pediatric): Secondary | ICD-10-CM | POA: Diagnosis not present

## 2019-02-10 DIAGNOSIS — E0590 Thyrotoxicosis, unspecified without thyrotoxic crisis or storm: Secondary | ICD-10-CM

## 2019-02-10 DIAGNOSIS — G4710 Hypersomnia, unspecified: Secondary | ICD-10-CM

## 2019-02-10 NOTE — Progress Notes (Signed)
02/10/2019- 43 yoF never smoker for sleep evaluation, Medical problem list includes Allergic Rhinitis, Asthma, GERD, Chronic cough, LPR, Hashimoto's Thyroiditis,  Meds include Dulera 200, singulair, Proair hfa, Zyrtec, synthroid She has seen Endocrinology for chronic symptoms of fatigue, joint pain, irregular periods, dry hair, palpitation and inability to lose weight. They recommended that OSA be ruled out. Body weight 224 lbs Epworth score 6 Bedtime bet 11PM-1:00AM. More daytime tiredness last 5-7 years. Wakes unrested 2-3 nights/ week.  Sleeps separate rooms due to husband's snoring.  Weight gain 30-40 lbs 2 years. Asthma when she has sinus infection, then uses her inhalers. No ENT surgery, but [place] in [place] has her on sublingual allergy drops. No sleep med, no caffeine.  Denies heart problems. Denies parasomnias.  Prior to Admission medications   Medication Sig Start Date End Date Taking? Authorizing Provider  levothyroxine (SYNTHROID) 50 MCG tablet Take 1 tablet (50 mcg total) by mouth daily. 01/29/19  Yes Shamleffer, Konrad DoloresIbtehal Jaralla, MD  montelukast (SINGULAIR) 10 MG tablet TAKE 1 TABLET BY MOUTH EVERY NIGHT AT BEDTIME 10/22/11  Yes Coralyn HellingSood, Vineet, MD  NON FORMULARY daily. Allergy sublingual drops   Yes [provider]  pantoprazole (PROTONIX) 40 MG tablet Take 40 mg by mouth 2 (two) times daily.   Yes [provider]   Past Medical History:  Diagnosis Date  . ASTHMA 02/08/2010  . Chronic cough   . Chronic rhinitis   . GERD (gastroesophageal reflux disease)   . TMJ (temporomandibular joint syndrome)    Past Surgical History:  Procedure Laterality Date  . BREAST ENHANCEMENT SURGERY     Family History  Problem Relation Age of Onset  . Diabetes Maternal Grandmother   . Heart failure Father   . Diabetes Father    Social History   Socioeconomic History  . Marital status: Divorced    Spouse name: Not on file  . Number of children: Not on file  . Years of  education: Not on file  . Highest education level: Not on file  Occupational History  . Occupation: self employed  Social Needs  . Financial resource strain: Not on file  . Food insecurity    Worry: Not on file    Inability: Not on file  . Transportation needs    Medical: Not on file    Non-medical: Not on file  Tobacco Use  . Smoking status: Never Smoker  . Smokeless tobacco: Never Used  Substance and Sexual Activity  . Alcohol use: Not on file  . Drug use: Not on file  . Sexual activity: Not on file  Lifestyle  . Physical activity    Days per week: Not on file    Minutes per session: Not on file  . Stress: Not on file  Relationships  . Social Musicianconnections    Talks on phone: Not on file    Gets together: Not on file    Attends religious service: Not on file    Active member of club or organization: Not on file    Attends meetings of clubs or organizations: Not on file    Relationship status: Not on file  . Intimate partner violence    Fear of current or ex partner: Not on file    Emotionally abused: Not on file    Physically abused: Not on file    Forced sexual activity: Not on file  Other Topics Concern  . Not on file  Social History Narrative  . Not on file   ROS-see  HPI   + = positive Constitutional:    weight loss, night sweats, fevers, chills, +fatigue, lassitude. HEENT:    headaches, difficulty swallowing, tooth/dental problems, sore throat,       sneezing, itching, ear ache, nasal congestion, post nasal drip, snoring CV:    chest pain, orthopnea, PND, swelling in lower extremities, anasarca,                                  dizziness, palpitations Resp:   shortness of breath with exertion or at rest.                productive cough,   non-productive cough, coughing up of blood.              change in color of mucus.  wheezing.   Skin:    rash or lesions. GI:  No-   heartburn, indigestion, abdominal pain, nausea, vomiting, diarrhea,                 change in  bowel habits, loss of appetite GU: dysuria, change in color of urine, no urgency or frequency.   flank pain. MS:   joint pain, stiffness, decreased range of motion, back pain. Neuro-     nothing unusual Psych:  change in mood or affect.  depression or anxiety.   memory loss.  OBJ- Physical Exam General- Alert, Oriented, Affect-appropriate, Distress- none acute, + overweight Skin- rash-none, lesions- none, excoriation- none Lymphadenopathy- none Head- atraumatic            Eyes- Gross vision intact, PERRLA, conjunctivae and secretions clear            Ears- Hearing, canals-normal            Nose- Clear, +Septal dev, mucus, polyps, erosion, perforation             Throat- Mallampati II-III , mucosa clear , drainage- none, tonsils- atrophic Neck- flexible , trachea midline, no stridor , thyroid nl, carotid no bruit Chest - symmetrical excursion , unlabored           Heart/CV- RRR , no murmur , no gallop  , no rub, nl s1 s2                           - JVD- none , edema- none, stasis changes- none, varices- none           Lung- clear to P&A, wheeze- none, cough- none , dullness-none, rub- none           Chest wall-  Abd-  Br/ Gen/ Rectal- Not done, not indicated Extrem- cyanosis- none, clubbing, none, atrophy- none, strength- nl Neuro- grossly intact to observation

## 2019-02-10 NOTE — Patient Instructions (Signed)
Order- schedule sleep study- HST or Split Night      Dx OSA  Please cal about 2 weeks after your sleep test to see if results and recommendations are ready yet. If appropriate, we may be able to  Start treatment before we see you next.

## 2019-02-14 NOTE — Telephone Encounter (Signed)
PCC's can we check to see when pt can possibly be scheduled and if her insurance accepts home sleep test?

## 2019-02-14 NOTE — Telephone Encounter (Signed)
We call pt's in the order that the order is placed.  It usually take about 2 weeks to get a call from [place].  Her order was just placed on 8/24.  Right now we are calling the pt's that the order was put in last week.  Please let pt know thru MyChart we will call her as soon as we get to her.  May be another week before she is called.

## 2019-02-26 DIAGNOSIS — K828 Other specified diseases of gallbladder: Secondary | ICD-10-CM | POA: Diagnosis not present

## 2019-02-27 ENCOUNTER — Other Ambulatory Visit: Payer: Self-pay | Admitting: General Surgery

## 2019-02-27 ENCOUNTER — Ambulatory Visit: Payer: BC Managed Care – PPO

## 2019-02-27 ENCOUNTER — Other Ambulatory Visit: Payer: Self-pay

## 2019-02-27 DIAGNOSIS — G4733 Obstructive sleep apnea (adult) (pediatric): Secondary | ICD-10-CM | POA: Diagnosis not present

## 2019-02-27 DIAGNOSIS — K828 Other specified diseases of gallbladder: Secondary | ICD-10-CM

## 2019-02-28 DIAGNOSIS — G4733 Obstructive sleep apnea (adult) (pediatric): Secondary | ICD-10-CM | POA: Diagnosis not present

## 2019-03-03 DIAGNOSIS — G4710 Hypersomnia, unspecified: Secondary | ICD-10-CM | POA: Insufficient documentation

## 2019-03-03 NOTE — Assessment & Plan Note (Signed)
Reports 30-40 lb weight gain in recent years.  Emphasized impact on potential for sleep disordered breathing. Plan- encourage effort now to regain normal weight

## 2019-03-03 NOTE — Assessment & Plan Note (Signed)
She indicates this problem may have subsided. Followed by PCP. Potential thyroid bearing on weight gain and complaint of fatigue.

## 2019-03-03 NOTE — Assessment & Plan Note (Signed)
Weight gain raises potential that she has OSA. Basic sleep hygiene reviewed. Safe driving. Plan- sleep study, consider CPAP if appropriate

## 2019-03-06 ENCOUNTER — Other Ambulatory Visit: Payer: Self-pay | Admitting: Internal Medicine

## 2019-03-06 DIAGNOSIS — E063 Autoimmune thyroiditis: Secondary | ICD-10-CM

## 2019-03-10 ENCOUNTER — Other Ambulatory Visit: Payer: Self-pay | Admitting: Internal Medicine

## 2019-03-10 ENCOUNTER — Other Ambulatory Visit: Payer: Self-pay

## 2019-03-10 ENCOUNTER — Encounter: Payer: Self-pay | Admitting: Internal Medicine

## 2019-03-10 ENCOUNTER — Other Ambulatory Visit (INDEPENDENT_AMBULATORY_CARE_PROVIDER_SITE_OTHER): Payer: BC Managed Care – PPO

## 2019-03-10 DIAGNOSIS — E063 Autoimmune thyroiditis: Secondary | ICD-10-CM

## 2019-03-10 LAB — HEPATIC FUNCTION PANEL
ALT: 14 U/L (ref 0–35)
AST: 19 U/L (ref 0–37)
Albumin: 4.3 g/dL (ref 3.5–5.2)
Alkaline Phosphatase: 78 U/L (ref 39–117)
Bilirubin, Direct: 0 mg/dL (ref 0.0–0.3)
Total Bilirubin: 0.7 mg/dL (ref 0.2–1.2)
Total Protein: 7.6 g/dL (ref 6.0–8.3)

## 2019-03-10 LAB — TSH: TSH: 1.94 u[IU]/mL (ref 0.35–4.50)

## 2019-03-10 NOTE — Progress Notes (Signed)
Hepatic  

## 2019-03-11 ENCOUNTER — Encounter (HOSPITAL_COMMUNITY)
Admission: RE | Admit: 2019-03-11 | Discharge: 2019-03-11 | Disposition: A | Discharge status: Home / Self Care | Payer: BC Managed Care – PPO | Source: Ambulatory Visit | Attending: General Surgery | Admitting: General Surgery

## 2019-03-11 ENCOUNTER — Encounter (HOSPITAL_COMMUNITY): Payer: Self-pay | Admitting: Radiology

## 2019-03-11 DIAGNOSIS — R109 Unspecified abdominal pain: Secondary | ICD-10-CM | POA: Diagnosis not present

## 2019-03-11 DIAGNOSIS — K828 Other specified diseases of gallbladder: Secondary | ICD-10-CM | POA: Diagnosis not present

## 2019-03-11 LAB — T4, FREE: Free T4: 0.82 ng/dL (ref 0.60–1.60)

## 2019-03-11 MED ORDER — TECHNETIUM TC 99M MEBROFENIN IV KIT
5.1200 | PACK | Freq: Once | INTRAVENOUS | Status: AC | PRN
  Administered 2019-03-11: 5.12 via INTRAVENOUS

## 2019-03-12 NOTE — Telephone Encounter (Signed)
Homes sleep test did not show sleep apnea. She had an average of 3.7 apneas/ hour with slight drops in blood oxygen level. This is within normal limits, and would not explain her feeling of fatigue.

## 2019-03-12 NOTE — Telephone Encounter (Signed)
Dr. Annamaria Boots patient sent email asking about her sleep study results.   Please Advise Allergies  Allergen Reactions  . Tramadol Nausea And Vomiting  . Hydrocodone Nausea And Vomiting    Other reaction(s): Vomiting   . Oxycodone Nausea And Vomiting     Current Outpatient Medications on File Prior to Visit  Medication Sig Dispense Refill  . levothyroxine (SYNTHROID) 50 MCG tablet Take 1 tablet (50 mcg total) by mouth daily. 60 tablet 3  . montelukast (SINGULAIR) 10 MG tablet TAKE 1 TABLET BY MOUTH EVERY NIGHT AT BEDTIME 30 tablet 2  . NON FORMULARY daily. Allergy sublingual drops    . pantoprazole (PROTONIX) 40 MG tablet Take 40 mg by mouth 2 (two) times daily.     No current facility-administered medications on file prior to visit.

## 2019-03-17 ENCOUNTER — Encounter: Payer: Self-pay | Admitting: Internal Medicine

## 2019-03-20 DIAGNOSIS — L814 Other melanin hyperpigmentation: Secondary | ICD-10-CM | POA: Diagnosis not present

## 2019-03-20 DIAGNOSIS — D2239 Melanocytic nevi of other parts of face: Secondary | ICD-10-CM | POA: Diagnosis not present

## 2019-03-20 DIAGNOSIS — L578 Other skin changes due to chronic exposure to nonionizing radiation: Secondary | ICD-10-CM | POA: Diagnosis not present

## 2019-03-20 DIAGNOSIS — D485 Neoplasm of uncertain behavior of skin: Secondary | ICD-10-CM | POA: Diagnosis not present

## 2019-03-20 DIAGNOSIS — D225 Melanocytic nevi of trunk: Secondary | ICD-10-CM | POA: Diagnosis not present

## 2019-03-21 DIAGNOSIS — N83291 Other ovarian cyst, right side: Secondary | ICD-10-CM | POA: Diagnosis not present

## 2019-03-21 DIAGNOSIS — N838 Other noninflammatory disorders of ovary, fallopian tube and broad ligament: Secondary | ICD-10-CM | POA: Diagnosis not present

## 2019-03-21 DIAGNOSIS — N83292 Other ovarian cyst, left side: Secondary | ICD-10-CM | POA: Diagnosis not present

## 2019-04-07 ENCOUNTER — Ambulatory Visit: Payer: BC Managed Care – PPO | Admitting: Internal Medicine

## 2019-04-10 DIAGNOSIS — J3089 Other allergic rhinitis: Secondary | ICD-10-CM | POA: Diagnosis not present

## 2019-04-22 ENCOUNTER — Other Ambulatory Visit: Payer: Self-pay

## 2019-04-24 ENCOUNTER — Ambulatory Visit: Payer: BC Managed Care – PPO | Admitting: Internal Medicine

## 2019-04-24 ENCOUNTER — Encounter: Payer: Self-pay | Admitting: Internal Medicine

## 2019-04-24 VITALS — BP 120/70 | HR 86 | Temp 98.4°F | Ht 67.0 in | Wt 224.8 lb

## 2019-04-24 DIAGNOSIS — E063 Autoimmune thyroiditis: Secondary | ICD-10-CM

## 2019-04-24 LAB — T4, FREE: Free T4: 0.78 ng/dL (ref 0.60–1.60)

## 2019-04-24 LAB — TSH: TSH: 2.94 u[IU]/mL (ref 0.35–4.50)

## 2019-04-24 NOTE — Progress Notes (Signed)
Name: Nicole Foley  MRN/ DOB: [id number], 1975-12-02    Age/ Sex: 43 y.o., female     PCP: Nicole Kingdom, MD   Reason for Endocrinology Evaluation: Hashimoto's Disease     Initial Endocrinology Clinic Visit: 01/29/2019    PATIENT IDENTIFIER: Nicole Foley is a 43 y.o., female with unremarkable past medical history. She has followed with  Endocrinology clinic since 01/29/2019 for consultative assistance with management of her Hashimoto's disease   HISTORICAL SUMMARY: The patient was noted to have abnormal TFT's during routine lab draw with a TSH 0.094 uIU/mL in 12/2018. She was also found to have elevated TPO Ab's at 262 IU/mL. She has chronic symptoms of  Fatigue, joint pain, irregular periods, dry hair , palpitations  and inability to lose weight. She was started on LT-4 replacement in 01/2019 due to a TSH of 5.95 uIU/mL, FT4 0.61 ng/dL (1.6-0.1)  and multiple symptoms.    SUBJECTIVE:   During last visit (01/29/2019): Started on Levothyroxine 50 mcg daily   Today (04/25/2019):  Nicole Foley is here for a follow up on hashimoto's thyroid disease.  She is compliant with LT-4 replacement.    She feels better overall with thinking clearly and more motivated then she was in the past, she did notice an improvement within 2-3 weeks of taking levothyroxine but since then she has been tired again.  She has occasional headaches.  Sleep apnea test negative   ROS:  As per HPI.   HISTORY:  Past Medical History:  Past Medical History:  Diagnosis Date  . ASTHMA 02/08/2010  . Chronic cough   . Chronic rhinitis   . GERD (gastroesophageal reflux disease)   . TMJ (temporomandibular joint syndrome)    Past Surgical History:  Past Surgical History:  Procedure Laterality Date  . BREAST ENHANCEMENT SURGERY      Social History:  reports that she has never smoked. She has never used smokeless tobacco. No history on file for alcohol and drug. Family History:  Family History  Problem  Relation Age of Onset  . Diabetes Maternal Grandmother   . Heart failure Father   . Diabetes Father      HOME MEDICATIONS: Allergies as of 04/24/2019      Reactions   Tramadol Nausea And Vomiting   Hydrocodone Nausea And Vomiting   Other reaction(s): Vomiting   Oxycodone Nausea And Vomiting      Medication List       Accurate as of April 24, 2019 11:59 PM. If you have any questions, ask your nurse or doctor.        levothyroxine 50 MCG tablet Commonly known as: SYNTHROID Take 1 tablet (50 mcg total) by mouth daily.   montelukast 10 MG tablet Commonly known as: SINGULAIR TAKE 1 TABLET BY MOUTH EVERY NIGHT AT BEDTIME   NON FORMULARY daily. Allergy sublingual drops   pantoprazole 40 MG tablet Commonly known as: PROTONIX Take 40 mg by mouth 2 (two) times daily.         OBJECTIVE:   PHYSICAL EXAM: VS: BP 120/70 (BP Location: Left Arm, Patient Position: Sitting, Cuff Size: Normal)   Pulse 86   Temp 98.4 F (36.9 C) (Oral)   Ht 5\' 7"  (1.702 m)   Wt 224 lb 12.8 oz (102 kg)   SpO2 97%   BMI 35.21 kg/m    EXAM: General: Pt appears well and is in NAD  Neck: General: Supple without adenopathy. Thyroid: Thyroid size normal.  No goiter or nodules appreciated.  No thyroid bruit.  Lungs: Clear with good BS bilat with no rales, rhonchi, or wheezes  Heart: Auscultation: RRR.  Abdomen: Normoactive bowel sounds, soft, nontender, without masses or organomegaly palpable  Extremities:  BL LE: No pretibial edema normal ROM and strength.  Neuro: Cranial nerves: II - XII grossly intact  Motor: Normal strength throughout DTRs: 2+ and symmetric in UE without delay in relaxation phase  Mental Status: Judgment, insight: Intact Orientation: Oriented to time, place, and person Mood and affect: No depression, anxiety, or agitation     DATA REVIEWED:  Results for ARLETHIA, BASSO (MRN [id number]) as of 04/25/2019 07:56  Ref. Range 01/28/2019 14:55 03/10/2019 11:34 04/24/2019 10:06   TSH Latest Ref Range: 0.35 - 4.50 uIU/mL 5.95 (H) 1.94 2.94  Triiodothyronine (T3) Latest Ref Range: 76 - 181 ng/dL 98    T4,Free(Direct) Latest Ref Range: 0.60 - 1.60 ng/dL 0.61 0.82 0.78     ASSESSMENT / PLAN / RECOMMENDATIONS:   1. Hashimoto's Thyroiditis :  - Clinically she has some non-specific symptoms that are not attributable to her thyroid  - No local neck symptoms  - Pt educated extensively on the correct way to take levothyroxine (first thing in the morning with water, 30 minutes before eating or taking other medications). - Pt encouraged to double dose the following day if she were to miss a dose given long half-life of levothyroxine. - TFT's continue to be normal   Medications   Levothyroxine 50 mcg daily     F/u in 6 months   Signed electronically by: Nicole Guise, MD  [facility]  [facility] [address] [place], [place] [address] Phone: [phone] FAX: [phone]      CC: Nicole Kiel, MD [address] [place] [address] Phone: [phone]  Fax: [phone]   Return to Endocrinology clinic as below: Future Appointments  Date Time Provider [place]  10/23/2019 10:50 AM Nicole Foley, Nicole Crazier, MD [facility] None

## 2019-04-24 NOTE — Patient Instructions (Signed)

## 2019-05-01 ENCOUNTER — Ambulatory Visit: Payer: BC Managed Care – PPO | Admitting: Internal Medicine

## 2019-05-10 DIAGNOSIS — J3489 Other specified disorders of nose and nasal sinuses: Secondary | ICD-10-CM | POA: Diagnosis not present

## 2019-05-10 DIAGNOSIS — J029 Acute pharyngitis, unspecified: Secondary | ICD-10-CM | POA: Diagnosis not present

## 2019-05-10 DIAGNOSIS — Z20828 Contact with and (suspected) exposure to other viral communicable diseases: Secondary | ICD-10-CM | POA: Diagnosis not present

## 2019-05-10 DIAGNOSIS — R6883 Chills (without fever): Secondary | ICD-10-CM | POA: Diagnosis not present

## 2019-06-02 DIAGNOSIS — M216X1 Other acquired deformities of right foot: Secondary | ICD-10-CM | POA: Diagnosis not present

## 2019-06-02 DIAGNOSIS — Y838 Other surgical procedures as the cause of abnormal reaction of the patient, or of later complication, without mention of misadventure at the time of the procedure: Secondary | ICD-10-CM | POA: Diagnosis not present

## 2019-06-02 DIAGNOSIS — M216X2 Other acquired deformities of left foot: Secondary | ICD-10-CM | POA: Diagnosis not present

## 2019-06-02 DIAGNOSIS — T8484XA Pain due to internal orthopedic prosthetic devices, implants and grafts, initial encounter: Secondary | ICD-10-CM | POA: Diagnosis not present

## 2019-06-02 DIAGNOSIS — S86302S Unspecified injury of muscle(s) and tendon(s) of peroneal muscle group at lower leg level, left leg, sequela: Secondary | ICD-10-CM | POA: Diagnosis not present

## 2019-06-02 DIAGNOSIS — R609 Edema, unspecified: Secondary | ICD-10-CM | POA: Diagnosis not present

## 2019-06-02 DIAGNOSIS — Z20828 Contact with and (suspected) exposure to other viral communicable diseases: Secondary | ICD-10-CM | POA: Diagnosis not present

## 2019-06-02 DIAGNOSIS — X58XXXS Exposure to other specified factors, sequela: Secondary | ICD-10-CM | POA: Diagnosis not present

## 2019-06-02 DIAGNOSIS — Z01818 Encounter for other preprocedural examination: Secondary | ICD-10-CM | POA: Diagnosis not present

## 2019-06-04 DIAGNOSIS — T8484XA Pain due to internal orthopedic prosthetic devices, implants and grafts, initial encounter: Secondary | ICD-10-CM | POA: Diagnosis not present

## 2019-06-04 DIAGNOSIS — Y831 Surgical operation with implant of artificial internal device as the cause of abnormal reaction of the patient, or of later complication, without mention of misadventure at the time of the procedure: Secondary | ICD-10-CM | POA: Diagnosis not present

## 2019-06-04 DIAGNOSIS — E063 Autoimmune thyroiditis: Secondary | ICD-10-CM | POA: Diagnosis not present

## 2019-06-04 DIAGNOSIS — R202 Paresthesia of skin: Secondary | ICD-10-CM | POA: Diagnosis not present

## 2019-06-04 DIAGNOSIS — Z885 Allergy status to narcotic agent status: Secondary | ICD-10-CM | POA: Diagnosis not present

## 2019-06-04 DIAGNOSIS — M7672 Peroneal tendinitis, left leg: Secondary | ICD-10-CM | POA: Diagnosis not present

## 2019-06-04 DIAGNOSIS — M79672 Pain in left foot: Secondary | ICD-10-CM | POA: Diagnosis not present

## 2019-06-04 DIAGNOSIS — K219 Gastro-esophageal reflux disease without esophagitis: Secondary | ICD-10-CM | POA: Diagnosis not present

## 2019-06-04 DIAGNOSIS — Z9889 Other specified postprocedural states: Secondary | ICD-10-CM | POA: Diagnosis not present

## 2019-06-04 DIAGNOSIS — J45909 Unspecified asthma, uncomplicated: Secondary | ICD-10-CM | POA: Diagnosis not present

## 2019-06-04 DIAGNOSIS — Z888 Allergy status to other drugs, medicaments and biological substances status: Secondary | ICD-10-CM | POA: Diagnosis not present

## 2019-06-04 DIAGNOSIS — M7671 Peroneal tendinitis, right leg: Secondary | ICD-10-CM | POA: Diagnosis not present

## 2019-06-04 DIAGNOSIS — M79671 Pain in right foot: Secondary | ICD-10-CM | POA: Diagnosis not present

## 2019-06-04 DIAGNOSIS — Z79899 Other long term (current) drug therapy: Secondary | ICD-10-CM | POA: Diagnosis not present

## 2019-08-28 ENCOUNTER — Encounter: Payer: Self-pay | Admitting: Internal Medicine

## 2019-09-02 ENCOUNTER — Encounter: Payer: Self-pay | Admitting: Internal Medicine

## 2019-09-02 ENCOUNTER — Other Ambulatory Visit: Payer: Self-pay

## 2019-09-02 MED ORDER — LEVOTHYROXINE SODIUM 50 MCG PO TABS: 50.0000 ug | ORAL_TABLET | Freq: Every day | ORAL | 3 refills | Status: AC

## 2019-09-03 ENCOUNTER — Encounter: Payer: Self-pay | Admitting: Internal Medicine

## 2019-09-24 ENCOUNTER — Encounter: Payer: Self-pay | Admitting: Internal Medicine

## 2019-09-24 DIAGNOSIS — M7671 Peroneal tendinitis, right leg: Secondary | ICD-10-CM | POA: Diagnosis not present

## 2019-09-24 DIAGNOSIS — T8484XA Pain due to internal orthopedic prosthetic devices, implants and grafts, initial encounter: Secondary | ICD-10-CM | POA: Diagnosis not present

## 2019-09-24 DIAGNOSIS — S86302S Unspecified injury of muscle(s) and tendon(s) of peroneal muscle group at lower leg level, left leg, sequela: Secondary | ICD-10-CM | POA: Diagnosis not present

## 2019-09-24 DIAGNOSIS — M216X1 Other acquired deformities of right foot: Secondary | ICD-10-CM | POA: Diagnosis not present

## 2019-10-14 ENCOUNTER — Other Ambulatory Visit: Payer: Self-pay

## 2019-10-16 ENCOUNTER — Encounter: Payer: Self-pay | Admitting: Internal Medicine

## 2019-10-16 ENCOUNTER — Ambulatory Visit: Payer: BC Managed Care – PPO | Admitting: Internal Medicine

## 2019-10-16 ENCOUNTER — Other Ambulatory Visit: Payer: Self-pay

## 2019-10-16 VITALS — BP 118/78 | HR 72 | Temp 98.2°F | Ht 67.0 in | Wt 227.8 lb

## 2019-10-16 DIAGNOSIS — R7301 Impaired fasting glucose: Secondary | ICD-10-CM

## 2019-10-16 DIAGNOSIS — E063 Autoimmune thyroiditis: Secondary | ICD-10-CM | POA: Diagnosis not present

## 2019-10-16 DIAGNOSIS — K760 Fatty (change of) liver, not elsewhere classified: Secondary | ICD-10-CM | POA: Diagnosis not present

## 2019-10-16 LAB — COMPREHENSIVE METABOLIC PANEL
ALT: 15 U/L (ref 0–35)
AST: 17 U/L (ref 0–37)
Albumin: 4.3 g/dL (ref 3.5–5.2)
Alkaline Phosphatase: 74 U/L (ref 39–117)
BUN: 13 mg/dL (ref 6–23)
CO2: 30 mEq/L (ref 19–32)
Calcium: 9.4 mg/dL (ref 8.4–10.5)
Chloride: 103 mEq/L (ref 96–112)
Creatinine, Ser: 0.78 mg/dL (ref 0.40–1.20)
GFR: 79.8 mL/min (ref >60.00)
Glucose, Bld: 127 mg/dL — ABNORMAL HIGH (ref 70–99)
Potassium: 4.7 mEq/L (ref 3.5–5.1)
Sodium: 140 mEq/L (ref 135–145)
Total Bilirubin: 0.4 mg/dL (ref 0.2–1.2)
Total Protein: 7.7 g/dL (ref 6.0–8.3)

## 2019-10-16 LAB — T4, FREE: Free T4: 0.8 ng/dL (ref 0.60–1.60)

## 2019-10-16 LAB — TSH: TSH: 2.14 u[IU]/mL (ref 0.35–4.50)

## 2019-10-16 NOTE — Patient Instructions (Addendum)
You are on levothyroxine - which is your thyroid hormone supplement. You MUST take this consistently.  You should take this first thing in the morning on an empty stomach with water. You should not take it with other medications. Wait 30min to 1hr prior to eating. If you are taking any vitamins - please take these in the evening.   If you miss a dose, please take your missed dose the following day (double the dose for that day). You should have a pill box for ONLY levothyroxine on your bedside table to help you remember to take your medications. [id number]  

## 2019-10-16 NOTE — Progress Notes (Signed)
Name: Nicole Foley  MRN/ DOB: [id number], 1975/10/28    Age/ Sex: 44 y.o., female     PCP: Philemon Kingdom, MD   Reason for Endocrinology Evaluation: Hashimoto's Disease     Initial Endocrinology Clinic Visit: 01/29/2019    PATIENT IDENTIFIER: Nicole Foley is a 44 y.o., female with unremarkable past medical history. She has followed with [facility] since 01/29/2019 for consultative assistance with management of her Hashimoto's disease   HISTORICAL SUMMARY: The patient was noted to have abnormal TFT's during routine lab draw with a TSH 0.094 uIU/mL in 12/2018. She was also found to have elevated TPO Ab's at 262 IU/mL. She has chronic symptoms of  Fatigue, joint pain, irregular periods, dry hair , palpitations  and inability to lose weight. She was started on LT-4 replacement in 01/2019 due to a TSH of 5.95 uIU/mL, FT4 0.61 ng/dL (0.1-6.0)  and multiple symptoms.    SUBJECTIVE:   During last visit (04/24/2019): Continue  Levothyroxine 50 mcg daily   Today (10/16/2019):  [name] is here for a follow up on hashimoto's thyroid disease.  She is compliant with LT-4 replacement.   She has been having alternating cold with heat intolerance  She is on OCP's  Has constipation alternating with diarrhea Weight has been stable   Denies local neck symptoms    Left foot sx in 05/2019    ROS:  As per HPI.   HISTORY:  Past Medical History:  Past Medical History:  Diagnosis Date  . ASTHMA 02/08/2010  . Chronic cough   . Chronic rhinitis   . GERD (gastroesophageal reflux disease)   . TMJ (temporomandibular joint syndrome)    Past Surgical History:  Past Surgical History:  Procedure Laterality Date  . BREAST ENHANCEMENT SURGERY      Social History:  reports that she has never smoked. She has never used smokeless tobacco. No history on file for alcohol and drug. Family History:  Family History  Problem Relation Age of Onset  . Diabetes Maternal Grandmother    . Heart failure Father   . Diabetes Father      HOME MEDICATIONS: Allergies as of 10/16/2019      Reactions   Tramadol Nausea And Vomiting   Hydrocodone Nausea And Vomiting   Other reaction(s): Vomiting   Oxycodone Nausea And Vomiting      Medication List       Accurate as of October 16, 2019  8:38 AM. If you have any questions, ask your nurse or doctor.        ALLERGY 24-HR PO Take by mouth.   levothyroxine 50 MCG tablet Commonly known as: SYNTHROID Take 1 tablet (50 mcg total) by mouth daily.   montelukast 10 MG tablet Commonly known as: SINGULAIR TAKE 1 TABLET BY MOUTH EVERY NIGHT AT BEDTIME   NON FORMULARY daily. Allergy sublingual drops   pantoprazole 40 MG tablet Commonly known as: PROTONIX Take 40 mg by mouth 2 (two) times daily.         OBJECTIVE:   PHYSICAL EXAM: VS: BP 118/78 (BP Location: Left Arm, Patient Position: Sitting, Cuff Size: Large)   Pulse 72   Temp 98.2 F (36.8 C)   Ht 5\' 7"  (1.702 m)   Wt 227 lb 12.8 oz (103.3 kg)   SpO2 98%   BMI 35.68 kg/m    EXAM: General: Pt appears well and is in NAD  Neck: General: Supple without adenopathy. Thyroid: Thyroid size normal.  No goiter or nodules  appreciated. No thyroid bruit.  Lungs: Clear with good BS bilat with no rales, rhonchi, or wheezes  Heart: Auscultation: RRR.  Abdomen: Normoactive bowel sounds, soft, nontender, without masses or organomegaly palpable  Extremities:  BL LE: No pretibial edema normal ROM and strength.  Neuro: Cranial nerves: II - XII grossly intact  Motor: Normal strength throughout DTRs: 2+ and symmetric in UE without delay in relaxation phase  Mental Status: Judgment, insight: Intact Orientation: Oriented to time, place, and person Mood and affect: No depression, anxiety, or agitation     DATA REVIEWED:  Results for SERITA, DEGROOTE (MRN [id number]) as of 10/17/2019 11:53  Ref. Range 10/16/2019 09:07  Sodium Latest Ref Range: 135 - 145 mEq/L 140  Potassium  Latest Ref Range: 3.5 - 5.1 mEq/L 4.7  Chloride Latest Ref Range: 96 - 112 mEq/L 103  CO2 Latest Ref Range: 19 - 32 mEq/L 30  Glucose Latest Ref Range: 70 - 99 mg/dL 127 (H)  BUN Latest Ref Range: 6 - 23 mg/dL 13  Creatinine Latest Ref Range: 0.40 - 1.20 mg/dL 0.78  Calcium Latest Ref Range: 8.4 - 10.5 mg/dL 9.4  Alkaline Phosphatase Latest Ref Range: 39 - 117 U/L 74  Albumin Latest Ref Range: 3.5 - 5.2 g/dL 4.3  AST Latest Ref Range: 0 - 37 U/L 17  ALT Latest Ref Range: 0 - 35 U/L 15  Total Protein Latest Ref Range: 6.0 - 8.3 g/dL 7.7  Total Bilirubin Latest Ref Range: 0.2 - 1.2 mg/dL 0.4  GFR Latest Ref Range: >60.00 mL/min 79.80  TSH Latest Ref Range: 0.35 - 4.50 uIU/mL 2.14  T4,Free(Direct) Latest Ref Range: 0.60 - 1.60 ng/dL 0.80     ASSESSMENT / PLAN / RECOMMENDATIONS:   1. Hashimoto's Thyroiditis :    - Pt is clinically euthyroid  - No local neck symptoms  - Repeat TFT's are optimal  Medications   Continue Levothyroxine 50 mcg daily   2. Fatty Liver:  - Repeat LFT's are normal.    F/u in 1 yr   Signed electronically by: Mack Guise, MD  [facility]  [address] [place], [place] [address] Phone: [phone] FAX: [phone]      CC: Ernestene Kiel, MD [address] [place] [address] Phone: [phone]  Fax: [phone]   Return to Endocrinology clinic as below: Future Appointments  Date Time Provider [facility]  10/16/2019  8:50 AM Lauro Manlove, Melanie Crazier, MD [id number] None

## 2019-10-17 DIAGNOSIS — R7309 Other abnormal glucose: Secondary | ICD-10-CM | POA: Diagnosis not present

## 2019-10-23 ENCOUNTER — Ambulatory Visit: Payer: BC Managed Care – PPO | Admitting: Internal Medicine

## 2020-05-18 IMAGING — NM NM HEPATO W/GB/PHARM/[PERSON_NAME]
2 series · 12 of 12 positions shown · non-contrast
Comparison: None.

CLINICAL DATA: Abdominal pain for several months. Nausea and fatty
food intolerance

EXAM:
NUCLEAR MEDICINE HEPATOBILIARY IMAGING WITH GALLBLADDER EF
VIEWS:
Anterior right upper quadrant
RADIOPHARMACEUTICALS:  5.12 mCi 3c-FFm  Choletec IV

[he hepatobiliary · 4.52mm/px · 6 of 60 frames shown (1 of 2)]
[frame 6/60]
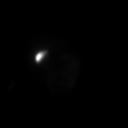
[frame 16/60]
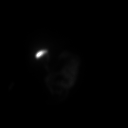
[frame 26/60]
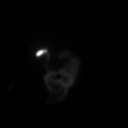
[frame 36/60]
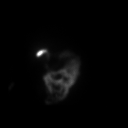
[frame 46/60]
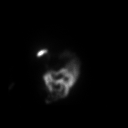
[frame 56/60]
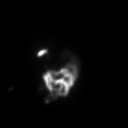

[he hepatobiliary · 4.52mm/px · 6 of 60 frames shown (2 of 2)]
[frame 6/60]
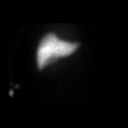
[frame 16/60]
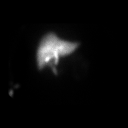
[frame 26/60]
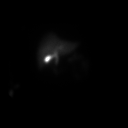
[frame 36/60]
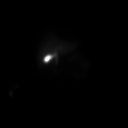
[frame 46/60]
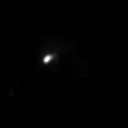
[frame 56/60]
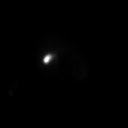

[12 of 12 positions shown; findings below may reference images not displayed]

FINDINGS: Liver uptake of radiotracer is unremarkable. There is prompt
visualization of gallbladder and small bowel, indicating patency of
the cystic and common bile ducts. The patient consumed 8 ounces of
Ensure orally with calculation of the computer generated ejection
fraction of radiotracer from the gallbladder. The patient
experienced upper abdominal pain with the oral Ensure consumption.
The computer generated ejection fraction of radiotracer from the
gallbladder is normal at 88%, normal greater than 33% using the oral
agent.
IMPRESSION: Normal ejection fraction of radiotracer from the gallbladder. The
patient did experience pain with the oral Ensure consumption. Cystic
and common bile ducts are patent as is evidenced by visualization of
gallbladder and small bowel.

## 2020-10-21 ENCOUNTER — Ambulatory Visit: Payer: BC Managed Care – PPO | Admitting: Internal Medicine
